# Patient Record
Sex: Female | Born: 1995 | Race: White | Hispanic: No | Marital: Single | Smoking: Never smoker
Health system: Southern US, Community
[De-identification: ages and names within clinical notes are randomized; demographics above are authoritative.]

## PROBLEM LIST (undated history)

## (undated) DIAGNOSIS — E119 Type 2 diabetes mellitus without complications: Secondary | ICD-10-CM

## (undated) DIAGNOSIS — F845 Asperger's syndrome: Secondary | ICD-10-CM

## (undated) HISTORY — PX: CRANIOTOMY FOR TUMOR: SUR345

---

## 2016-04-05 DIAGNOSIS — E1165 Type 2 diabetes mellitus with hyperglycemia: Secondary | ICD-10-CM

## 2016-04-05 DIAGNOSIS — Z794 Long term (current) use of insulin: Secondary | ICD-10-CM

## 2016-06-12 ENCOUNTER — Emergency Department: Payer: Medicare Other

## 2016-06-12 ENCOUNTER — Encounter (HOSPITAL_COMMUNITY): Payer: Self-pay | Admitting: Family Medicine

## 2016-06-12 ENCOUNTER — Emergency Department
Admission: EM | Admit: 2016-06-12 | Discharge: 2016-06-12 | Payer: Medicare Other | Attending: Emergency Medicine | Admitting: Emergency Medicine

## 2016-06-12 ENCOUNTER — Encounter: Payer: Self-pay | Admitting: Emergency Medicine

## 2016-06-12 ENCOUNTER — Observation Stay
Admission: AD | Admit: 2016-06-12 | Payer: Self-pay | Source: Other Acute Inpatient Hospital | Admitting: Internal Medicine

## 2016-06-12 ENCOUNTER — Inpatient Hospital Stay (HOSPITAL_COMMUNITY)
Admission: AD | Admit: 2016-06-12 | Discharge: 2016-06-14 | DRG: 880 | Disposition: A | Discharge status: Home / Self Care | Payer: Medicare Other | Source: Other Acute Inpatient Hospital | Attending: Internal Medicine | Admitting: Internal Medicine

## 2016-06-12 DIAGNOSIS — Z794 Long term (current) use of insulin: Secondary | ICD-10-CM

## 2016-06-12 DIAGNOSIS — G9389 Other specified disorders of brain: Secondary | ICD-10-CM | POA: Diagnosis not present

## 2016-06-12 DIAGNOSIS — Z8249 Family history of ischemic heart disease and other diseases of the circulatory system: Secondary | ICD-10-CM

## 2016-06-12 DIAGNOSIS — R569 Unspecified convulsions: Secondary | ICD-10-CM | POA: Diagnosis not present

## 2016-06-12 DIAGNOSIS — R410 Disorientation, unspecified: Secondary | ICD-10-CM

## 2016-06-12 DIAGNOSIS — Z86011 Personal history of benign neoplasm of the brain: Secondary | ICD-10-CM | POA: Diagnosis not present

## 2016-06-12 DIAGNOSIS — E119 Type 2 diabetes mellitus without complications: Secondary | ICD-10-CM | POA: Diagnosis not present

## 2016-06-12 DIAGNOSIS — F05 Delirium due to known physiological condition: Secondary | ICD-10-CM | POA: Diagnosis not present

## 2016-06-12 DIAGNOSIS — R4701 Aphasia: Secondary | ICD-10-CM | POA: Insufficient documentation

## 2016-06-12 DIAGNOSIS — F441 Dissociative fugue: Secondary | ICD-10-CM | POA: Insufficient documentation

## 2016-06-12 DIAGNOSIS — F845 Asperger's syndrome: Secondary | ICD-10-CM | POA: Diagnosis not present

## 2016-06-12 DIAGNOSIS — E1165 Type 2 diabetes mellitus with hyperglycemia: Secondary | ICD-10-CM | POA: Diagnosis not present

## 2016-06-12 DIAGNOSIS — E876 Hypokalemia: Secondary | ICD-10-CM | POA: Diagnosis present

## 2016-06-12 DIAGNOSIS — R000 Tachycardia, unspecified: Secondary | ICD-10-CM | POA: Diagnosis not present

## 2016-06-12 DIAGNOSIS — Z79899 Other long term (current) drug therapy: Secondary | ICD-10-CM | POA: Diagnosis not present

## 2016-06-12 DIAGNOSIS — R4182 Altered mental status, unspecified: Secondary | ICD-10-CM

## 2016-06-12 DIAGNOSIS — R4689 Other symptoms and signs involving appearance and behavior: Secondary | ICD-10-CM | POA: Diagnosis present

## 2016-06-12 HISTORY — DX: Asperger's syndrome: F84.5

## 2016-06-12 HISTORY — DX: Type 2 diabetes mellitus without complications: E11.9

## 2016-06-12 LAB — COMPREHENSIVE METABOLIC PANEL
ALBUMIN: 4.3 g/dL (ref 3.5–5.0)
ALT: 20 U/L (ref 14–54)
ANION GAP: 10 (ref 5–15)
AST: 23 U/L (ref 15–41)
Alkaline Phosphatase: 54 U/L (ref 38–126)
BUN: 13 mg/dL (ref 6–20)
CHLORIDE: 107 mmol/L (ref 101–111)
CO2: 20 mmol/L — ABNORMAL LOW (ref 22–32)
Calcium: 9.3 mg/dL (ref 8.9–10.3)
Creatinine, Ser: 0.7 mg/dL (ref 0.44–1.00)
GFR calc Af Amer: >60 mL/min (ref >60)
GFR calc non Af Amer: >60 mL/min (ref >60)
GLUCOSE: 221 mg/dL — AB (ref 65–99)
POTASSIUM: 3.5 mmol/L (ref 3.5–5.1)
Sodium: 137 mmol/L (ref 135–145)
Total Bilirubin: 0.6 mg/dL (ref 0.3–1.2)
Total Protein: 7.8 g/dL (ref 6.5–8.1)

## 2016-06-12 LAB — GLUCOSE, CAPILLARY
GLUCOSE-CAPILLARY: 231 mg/dL — AB (ref 65–99)
GLUCOSE-CAPILLARY: 184 mg/dL — AB (ref 65–99)
Glucose-Capillary: 212 mg/dL — ABNORMAL HIGH (ref 65–99)
Glucose-Capillary: 235 mg/dL — ABNORMAL HIGH (ref 65–99)

## 2016-06-12 LAB — URINALYSIS, COMPLETE (UACMP) WITH MICROSCOPIC
BILIRUBIN URINE: NEGATIVE
Bacteria, UA: NONE SEEN
GLUCOSE, UA: 150 mg/dL — AB
KETONES UR: 20 mg/dL — AB
LEUKOCYTES UA: NEGATIVE
Nitrite: NEGATIVE
PH: 6 (ref 5.0–8.0)
PROTEIN: NEGATIVE mg/dL
Specific Gravity, Urine: 1.015 (ref 1.005–1.030)

## 2016-06-12 LAB — AMMONIA: AMMONIA: 34 umol/L (ref 9–35)

## 2016-06-12 LAB — TSH: TSH: 2.247 u[IU]/mL (ref 0.350–4.500)

## 2016-06-12 LAB — CBC
HCT: 37.7 % (ref 35.0–47.0)
HEMOGLOBIN: 13.2 g/dL (ref 12.0–16.0)
MCH: 29.3 pg (ref 26.0–34.0)
MCHC: 35 g/dL (ref 32.0–36.0)
MCV: 83.5 fL (ref 80.0–100.0)
PLATELETS: 244 10*3/uL (ref 150–440)
RBC: 4.51 MIL/uL (ref 3.80–5.20)
RDW: 13.9 % (ref 11.5–14.5)
WBC: 8.4 10*3/uL (ref 3.6–11.0)

## 2016-06-12 LAB — HCG, QUANTITATIVE, PREGNANCY: hCG, Beta Chain, Quant, S: <1 m[IU]/mL (ref <5)

## 2016-06-12 MED ORDER — INSULIN GLARGINE 100 UNIT/ML ~~LOC~~ SOLN
20.0000 [IU] | Freq: Every day | SUBCUTANEOUS | Status: DC
  Administered 2016-06-12 – 2016-06-13 (×2): 20 [IU] via SUBCUTANEOUS
  Filled 2016-06-12 (×2): qty 0.2

## 2016-06-12 MED ORDER — ONDANSETRON HCL 4 MG PO TABS: 4.0000 mg | ORAL_TABLET | Freq: Four times a day (QID) | ORAL | Status: DC | PRN

## 2016-06-12 MED ORDER — SODIUM CHLORIDE 0.9 % IV BOLUS (SEPSIS)
500.0000 mL | Freq: Once | INTRAVENOUS | Status: AC
  Administered 2016-06-12: 500 mL via INTRAVENOUS

## 2016-06-12 MED ORDER — ONDANSETRON HCL 4 MG/2ML IJ SOLN
4.0000 mg | Freq: Four times a day (QID) | INTRAMUSCULAR | Status: DC | PRN
  Administered 2016-06-13: 4 mg via INTRAVENOUS

## 2016-06-12 MED ORDER — ACETAMINOPHEN 650 MG RE SUPP: 650.0000 mg | Freq: Four times a day (QID) | RECTAL | Status: DC | PRN

## 2016-06-12 MED ORDER — INSULIN ASPART 100 UNIT/ML ~~LOC~~ SOLN
0.0000 [IU] | Freq: Three times a day (TID) | SUBCUTANEOUS | Status: DC
  Administered 2016-06-13 (×2): 5 [IU] via SUBCUTANEOUS
  Administered 2016-06-14: 8 [IU] via SUBCUTANEOUS
  Administered 2016-06-14: 2 [IU] via SUBCUTANEOUS
  Administered 2016-06-14: 8 [IU] via SUBCUTANEOUS

## 2016-06-12 MED ORDER — SODIUM CHLORIDE 0.9% FLUSH
3.0000 mL | Freq: Two times a day (BID) | INTRAVENOUS | Status: DC
  Administered 2016-06-12 – 2016-06-14 (×3): 3 mL via INTRAVENOUS

## 2016-06-12 MED ORDER — VENLAFAXINE HCL ER 37.5 MG PO CP24
37.5000 mg | ORAL_CAPSULE | Freq: Two times a day (BID) | ORAL | Status: DC
  Filled 2016-06-12: qty 1

## 2016-06-12 MED ORDER — ACETAMINOPHEN 325 MG PO TABS
650.0000 mg | ORAL_TABLET | Freq: Four times a day (QID) | ORAL | Status: DC | PRN
  Administered 2016-06-14 (×2): 650 mg via ORAL
  Filled 2016-06-12 (×2): qty 2

## 2016-06-12 MED ORDER — INSULIN ASPART 100 UNIT/ML ~~LOC~~ SOLN
0.0000 [IU] | Freq: Every day | SUBCUTANEOUS | Status: DC
  Administered 2016-06-13: 2 [IU] via SUBCUTANEOUS

## 2016-06-12 MED ORDER — ENOXAPARIN SODIUM 40 MG/0.4ML ~~LOC~~ SOLN
40.0000 mg | SUBCUTANEOUS | Status: DC
  Administered 2016-06-12 – 2016-06-13 (×2): 40 mg via SUBCUTANEOUS
  Filled 2016-06-12 (×2): qty 0.4

## 2016-06-12 NOTE — Consult Note (Signed)
NEURO HOSPITALIST CONSULT NOTE   Requestig physician: Dr. Posey Pronto  Reason for Consult: Seizures  History obtained from:   Chart    HPI:                                                                                                                                          Robin Murphy is an 21 y.o. female with Asperger's syndrome who presents to [facility] in transfer from OSH for possible seizures. She initially presented to the OSH for assessment of abnormal behaviors which had begun worsening about January 11, culminating in a night of being locked in her mother's bedroom due to fear of elopement, during which she was not responding, was pacing continuously and eventually urinated on herself.   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 She was brought home by police as bystanders had found her lying on the ground in her pajamas without shoes or a jacket. She also has IDDM type 2; in this context the behavioral change has also manifested in her having recently stopped checking her blood sugars and she also has been consuming sugary foods while lying to her mother about this.    She was transferred to [facility] for EEG monitoring as it was thought that her behavioral changes may be secondary to frontal lobe seizures.   Her PMHx also includes a craniotomy for benign brain tumor at age 80 months.    Past Medical History:  Diagnosis Date  . Asperger syndrome   . Diabetes mellitus without complication [facility])     Past Surgical History:  Procedure Laterality Date  . CRANIOTOMY FOR TUMOR     Age 49 months, for benign brain tumor    Family History  Problem Relation Age of Onset  . Hypertension Mother   . Hypertension Maternal Grandfather   . Skin cancer Maternal Grandfather   . Hypertension Maternal Grandmother    Social History:  reports that she has never smoked. She has never used  smokeless tobacco. She reports that she does not drink alcohol. Her drug history is not on file.  Not on File  MEDICATIONS:  Scheduled: . enoxaparin (LOVENOX) injection  40 mg Subcutaneous Q24H  . insulin aspart  0-15 Units Subcutaneous TID WC  . insulin aspart  0-5 Units Subcutaneous QHS  . insulin glargine  20 Units Subcutaneous QHS  . sodium chloride flush  3 mL Intravenous Q12H   ROS:                                                                                                                                       The patient is mute for most of the evaluation and does not provide a ROS. She also does not reliably answer yes/no to ROS questions.  There were no vitals taken for this visit.   General Examination:                                                                                                      HEENT-  Normocephalic/atraumatic.   Lungs- No gross wheezing. Respirations unlabored.  Extremities- No edema. Warm and well-perfused.   Neurological Examination Mental Status: Initially drowsy, becomes alert during examination. Poor eye contact. Perseverative, short repetitive answers are unrelated to the content of most questions. Does not follow most commands; those that are followed require repeated requests. Limited answers to some questions are fluent. Names thumb, glove and pen; has difficulty naming pinky and index fingers. Initially appears catatonic during exam; when I mention this to nurse, the patient becomes more alert and ask what catatonia means; during my explanation the patient exhibits improved eye contact and smiles occasionally; affect and speech have a childlike quality during this portion of the assessment.  Cranial Nerves: PERRL. Does not follow commands for formal testing of visual fields. Attends to visual stimuli to left and right  occasionally. Horizontal saccades are intact. Does not follow commands for visual pursuits. No ptosis. No nystagmus. Smile is symmetric. Reacts to auditory stmuli. Head rotates to left and right normally. No lingual or pharyngeal dysarthria noted.  Motor: Right : Moves limbs to some commands with no gross asymmetry noted. Does not follow commands for formal strength testing. Tone is normal in all 4 extremities. No twitching, jerking, chorea or other adventitious movements noted.  Sensory: Does not answer questions for sensory testing.  Deep Tendon Reflexes: 2+ brachioradialis, biceps, patella and achilles bilaterally. Toes downgoing bilaterally. Negative Hoffman sign bilaterally.  Cerebellar: Does not follow commands for testing of coordination.  Gait: Deferred.   Lab Results: Basic Metabolic Panel:  Recent Labs Lab 06/12/16 0933  NA 137  K 3.5  CL 107  CO2 20*  GLUCOSE 221*  BUN 13  CREATININE 0.70  CALCIUM 9.3    Liver Function Tests:  Recent Labs Lab 06/12/16 0933  AST 23  ALT 20  ALKPHOS 54  BILITOT 0.6  PROT 7.8  ALBUMIN 4.3   No results for input(s): LIPASE, AMYLASE in the last 168 hours.  Recent Labs Lab 06/12/16 0933  AMMONIA 34    CBC:  Recent Labs Lab 06/12/16 0933  WBC 8.4  HGB 13.2  HCT 37.7  MCV 83.5  PLT 244    Cardiac Enzymes: No results for input(s): CKTOTAL, CKMB, CKMBINDEX, TROPONINI in the last 168 hours.  Lipid Panel: No results for input(s): CHOL, TRIG, HDL, CHOLHDL, VLDL, LDLCALC in the last 168 hours.  CBG:  Recent Labs Lab 06/12/16 0934 06/12/16 1155 06/12/16 1641 06/12/16 2053  GLUCAP 212* 231* 235* 184*    Microbiology: No results found for this or any previous visit.  Coagulation Studies: No results for input(s): LABPROT, INR in the last 72 hours.  Imaging: Ct Head Wo Contrast  Result Date: 06/12/2016 CLINICAL DATA:  Mental status change. EXAM: CT HEAD WITHOUT CONTRAST TECHNIQUE: Contiguous axial images were  obtained from the base of the skull through the vertex without intravenous contrast. COMPARISON:  None. FINDINGS: Brain: Ventricles are normal in size and configuration. 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 All other areas of the brain demonstrate normal gray-white matter attenuation. There is no mass, hemorrhage, edema or other evidence of acute parenchymal abnormality. No extra-axial hemorrhage. Vascular: No hyperdense vessel or unexpected calcification. Skull: Normal. Negative for fracture or focal lesion. Fixation hardware within the right temporal lobe. Sinuses/Orbits: No acute finding. Other: None. IMPRESSION: 1. No acute findings.  No intracranial mass, hemorrhage or edema. 2. 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 Electronically Signed   By: Franki Cabot M.D.   On: 06/12/2016 10:02   Dg Chest Portable 1 View  Result Date: 06/12/2016 CLINICAL DATA:  Altered mental status. EXAM: PORTABLE CHEST 1 VIEW COMPARISON:  None. FINDINGS: The heart size and mediastinal contours are within normal limits. Both lungs are clear. No pneumothorax or pleural effusion is noted. The visualized skeletal structures are unremarkable. IMPRESSION: No acute cardiopulmonary abnormality seen. Electronically Signed   By: Marijo Conception, M.D.   On: 06/12/2016 09:51   Assessment: 1. Behavioral changes in a 21 year old female with a history of Asperger's syndrome and prior benign brain tumor resection. DDx includes frontal or temporal lobe seizures given history of benign brain tumor resection (gliosis adjacent to the resection cavity could serve as a seizure focus). Of note, no twitching, jerking or other adventitious movements are seen on exam.  2. Also on DDx is behavioral changes due to life  stressors and poor coping skills in the setting of Asperger's syndrome.  3. 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 No acute findings are seen.  4. Ca, Na, ammonia, AST, ALT, CBC, TSH are normal. Urinalysis negative for infection.  5. IDDM type 2.  Recommendations: 1. Continuous EEG in the morning.  2. MRI brain with and without contrast.  3. Psychology consultation regarding Asperger's syndrome and recent possible stressors resulting in behavioral changes.  4. ESR,  magnesium, thiamine and vitamin B12 levels.   Electronically signed: Dr. Kerney Elbe 06/12/2016, 10:00 PM

## 2016-06-12 NOTE — ED Provider Notes (Signed)
Patient was signed out to me. Tell psychiatry apparently recommends EEG monitoring as they think this may be a few state. Her neurologist has no ability to do EEG monitoring at this hospital this time. He discussed the patient with hospitalist Dr. Judithann SheenSparks. He agreed to transfer the patient [facility] for continuous EEG monitoring. [facility].----------------------------------------- 5:21 PM on 06/12/2016 -----------------------------------------  I have spoken with the hospitalist on[facility]. Allena KatzPatel he asked me to talk to the neurologist. I'm not sure Dr. Garnette ScheuermannZeilkman and is spoken to the neurologist over there yet and are not but I will call her neurologist.  I discussed the patient with Dr. Petra KubaKilpatrick [facility] and with Dr. Theophilus KindsZeillkman and again with Dr. Allena KatzPatel and Dr. Allena KatzPatel except the patient Dr. Luisa HartPatrick will see the patient when they get there.   Arnaldo NatalPaul F Coleston Dirosa, MD 06/12/16 1739

## 2016-06-12 NOTE — H&P (Signed)
History and Physical  Patient Name: Robin Murphy     WUJ:[id number]    DOB: [birth year]    DOA: 06/12/2016 PCP: [facility]   Patient coming from: Home  Chief Complaint: Abnormal behavior  HPI: Robin Murphy is a 21 y.o. 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    The patient has a history of fugue states/flights, most recently a few weeks ago in the snow, she eloped at night, woke her mother at 5AM saying "the police are in the hall, they just brought me home" because she had wandered outside without shoes/jacket, been found by bystanders lying on the ground in her pajamas.    Her mother states there was an abrupt change in her behavior about Jan. 11th.  She stopped checking her BG, her sugars went up to the 300s-400s, and mother started to notice missing cookie boxes, sunkist cans, Pop Tarts.  When confronted, the patient lied about eating them.  Mother notes she seemed depressed lately; she is NOT currently on antidepressants.  She had previously been on an insulin pump but because of insurance reasons, this was denied and she was currently on just basal bolus insulin, and mother suspects she wasn't giving herself any.  Then last night, mother had a hunch she was going into another fugue state, so she locked her in the mother's bedroom with her to sleep.  Patient was not responding, seemed "absent" and was pacing by the door all night, but couldn't figure out how to get out, urinated on herself at one point.  Then this morning, mother brought her to the ER.    ED course: -Afebrile, heart rate 80, respirations and pulse ox normal, blood pressure 120/72 -Na 137, K 3.5, Cr 0.7, WBC 8.4K, Hgb 13.2 -Glucose 212 -Ammonia normal, TSH normal -UA few ketones, no pyuria or hematuria or bacteria -Pregnancy test negative -Chest x-ray normal -CT head showed chronic encephalomalacia in the right temporal  lobe, overlying hardware, no other abnormalities -ECG unremarkable -The case was discussed with neurology at [facility], then telemetry psychiatry at [facility] and ultimately the decision was made to transport to [facility] for continuous EEG, and our Neurology accepted and patient was sent for transfer          ROS: Review of Systems  Unable to perform ROS: Psychiatric disorder          Past Medical History:  Diagnosis Date  . Asperger syndrome   . Diabetes mellitus without complication [facility])     Past Surgical History:  Procedure Laterality Date  . CRANIOTOMY FOR TUMOR     Age 70 months, for benign brain tumor    Social History: Patient lives with her mother.  She is on disabiltiy for Aspergers.  She does Network engineer, doesn't leave the house.  Is independent with all ADLs.  The patient walks unassisted.  Doesn't smoke.    Not on File  Family history: family history includes Hypertension in her maternal grandfather, maternal grandmother, and mother; Skin cancer in her maternal grandfather.  Prior to Admission medications   Medication Sig Start Date End Date Taking? Authorizing Provider  acetaminophen (TYLENOL) 500 MG tablet Take 500 mg by mouth every 6 (six) hours as needed.    Historical Provider, MD  insulin aspart (NOVOLOG) 100 UNIT/ML injection Inject 14-24 Units into the skin 3 (three) times daily before meals.    Historical Provider, MD  insulin glargine (LANTUS)  100 UNIT/ML injection Inject 30 Units into the skin at bedtime as needed. Up to 30 units qhs and additional dosage 2 hours after her meals prn    Historical Provider, MD  NOVOLOG FLEXPEN 100 UNIT/ML FlexPen Inject 14 Units into the skin 3 (three) times daily with meals. 05/13/16   Historical Provider, MD       Physical Exam: LMP  (LMP Unknown)    General appearance: Well-developed, adult female, awake but closes eyes and goes back to sleep, no acute distress, only makes 1 word answers.   Eyes: Anicteric, conjunctiva  pink, lids and lashes normal. PERRL.    ENT: No nasal deformity, discharge, epistaxis.  Hearing seems normal. OP moist without lesions.   Neck: No neck masses.  Trachea midline.  No thyromegaly/tenderness. Lymph: No cervical or supraclavicular lymphadenopathy. Skin: Warm and dry.  No jaundice.  No suspicious rashes or lesions. Cardiac: RRR, nl S1-S2, no murmurs appreciated.  Capillary refill is brisk.  JVP normal.  No LE edema.  Radial and DP pulses 2+ and symmetric. Respiratory: Normal respiratory rate and rhythm.  CTAB without rales or wheezes. Abdomen: Abdomen soft.  No TTP. No ascites, distension, hepatosplenomegaly.   MSK: No deformities or effusions.  No cyanosis or clubbing. Neuro: Cranial nerves exam patient does not cooperate, but appear grossly symmetric.  Sensation unable to assess. Speech is fluent.  Muscle strength patient moves all extremities but makes minimal effort.    Psych: States it is "Monday".  Says "I don't know" to most questions.   Affect flat.    Labs on Admission:  I have personally reviewed following labs and imaging studies: CBC:  Recent Labs Lab 06/12/16 0933  WBC 8.4  HGB 13.2  HCT 37.7  MCV 83.5  PLT 244   Basic Metabolic Panel:  Recent Labs Lab 06/12/16 0933  NA 137  K 3.5  CL 107  CO2 20*  GLUCOSE 221*  BUN 13  CREATININE 0.70  CALCIUM 9.3   GFR: CrCl cannot be calculated (Unknown ideal weight.).  Liver Function Tests:  Recent Labs Lab 06/12/16 0933  AST 23  ALT 20  ALKPHOS 54  BILITOT 0.6  PROT 7.8  ALBUMIN 4.3   No results for input(s): LIPASE, AMYLASE in the last 168 hours.  Recent Labs Lab 06/12/16 0933  AMMONIA 34   Coagulation Profile: No results for input(s): INR, PROTIME in the last 168 hours. Cardiac Enzymes: No results for input(s): CKTOTAL, CKMB, CKMBINDEX, TROPONINI in the last 168 hours. BNP (last 3 results) No results for input(s): PROBNP in the last 8760 hours. HbA1C: No results for input(s): HGBA1C  in the last 72 hours. CBG:  Recent Labs Lab 06/12/16 0934 06/12/16 1155 06/12/16 1641  GLUCAP 212* 231* 235*   Lipid Profile: No results for input(s): CHOL, HDL, LDLCALC, TRIG, CHOLHDL, LDLDIRECT in the last 72 hours. Thyroid Function Tests:  Recent Labs  06/12/16 0933  TSH 2.247   Anemia Panel: No results for input(s): VITAMINB12, FOLATE, FERRITIN, TIBC, IRON, RETICCTPCT in the last 72 hours. Sepsis Labs: Invalid input(s): PROCALCITONIN, LACTICIDVEN No results found for this or any previous visit (from the past 240 hour(s)).       Radiological Exams on Admission: Personally reviewed: Ct Head Wo Contrast  Result Date: 06/12/2016 CLINICAL DATA:  Mental status change. EXAM: CT HEAD WITHOUT CONTRAST TECHNIQUE: Contiguous axial images were obtained from the base of the skull through the vertex without intravenous contrast. COMPARISON:  None. FINDINGS: Brain: Ventricles are normal in size  and configuration. 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 All other areas of the brain demonstrate normal gray-white matter attenuation. There is no mass, hemorrhage, edema or other evidence of acute parenchymal abnormality. No extra-axial hemorrhage. Vascular: No hyperdense vessel or unexpected calcification. Skull: Normal. Negative for fracture or focal lesion. Fixation hardware within the right temporal lobe. Sinuses/Orbits: No acute finding. Other: None. IMPRESSION: 1. No acute findings.  No intracranial mass, hemorrhage or edema. 2. 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 Electronically Signed   By: Bary RichardStan  Maynard M.D.   On: 06/12/2016 10:02   Dg Chest Portable 1 View  Result Date: 06/12/2016 CLINICAL DATA:  Altered mental status. EXAM: PORTABLE CHEST 1 VIEW COMPARISON:   None. FINDINGS: The heart size and mediastinal contours are within normal limits. Both lungs are clear. No pneumothorax or pleural effusion is noted. The visualized skeletal structures are unremarkable. IMPRESSION: No acute cardiopulmonary abnormality seen. Electronically Signed   By: Lupita RaiderJames  Green Jr, M.D.   On: 06/12/2016 09:51    EKG: Independently reviewed. Rate 76, QTc 429, normal sinus rhythm.    Assessment/Plan  1. Abnormal behavior:  Suspect depression, ?fugue.  Differential includes temporal lobe seizures, obviously, given her history of tumor removal as infant.   -Consult to Neurology -Consult to Psychiatry -Flight risk, discussed with nursing   2. Insulin dependent diabetes:  Usually on Lantus 30 nightly at home.  Currently glucose near normal.  No evidence of DKA or significant hyperglycemia driving symptoms. -Check [id number] -Lantus 20 -SSI with meals       DVT prophylaxis: Lovenox  Code Status: FULL  Family Communication:  Mother by phone  Disposition Plan: Anticipate Neurology consultation for vEEG or cEEG and then Psychiatry consultation. Consults called: Neuro Admission status: OBS At the point of initial evaluation, it is my clinical opinion that admission for OBSERVATION is reasonable and necessary because the patient's presenting complaints in the context of their chronic conditions represent sufficient risk of deterioration or significant morbidity to constitute reasonable grounds for close observation in the hospital setting, but that the patient may be medically stable for discharge from the hospital within 24 to 48 hours.    Medical decision making: Patient seen at 8:52 PM on 06/12/2016.  What exists of the patient's chart was reviewed in depth and summarized above.  Clinical condition: stable.        Alberteen SamChristopher P Danford [facility] Pager [phone]

## 2016-06-12 NOTE — ED Notes (Addendum)
Spoke with patient's mother on the phone regarding possible [facility] and EEG monitoring. Patient's mother verbalizes agreement for transfer and requests updates when plans are finalized.

## 2016-06-12 NOTE — ED Notes (Signed)
Unable to complete remainder of triage at this time due to patient being non-verbal and no family present at this time.

## 2016-06-12 NOTE — ED Provider Notes (Addendum)
[facility] Emergency Department Provider Note   ____________________________________________   First MD Initiated Contact with Patient 06/12/16 [phone]     (approximate)  I have reviewed the triage vital signs and the nursing notes.   HISTORY  Chief Complaint Altered Mental Status    HPI Robin Murphy is a 21 y.o. female here for evaluation of alteration of mental status  EM caveat: The patient is nonverbal  The patient's mother provides most history. 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 Mom reports that over the last 2 weeks patient has had occasional strange behavior, she has a known history of wandering in the past and that time she is had to have the police and helicopter look for her. Mom reports that patient has diabetes, she came into her room last night around midnight and seemed confused, reported to her mom that her blood sugar was in the 160s and she gave herself insulin, but her mom checked her meter etc. was in the 460s, and patient has been wandering about the home, walking, speaking 1-2 words at a time, and had urinated herself today.  The mother reports hospitalizations or similar evaluations in [place] and [place], and his lymphoma passes been related to a "blood sugar problem   Past Medical History:  Diagnosis Date  . Asperger syndrome   . Diabetes mellitus without complication (HCC)     There are no active problems to display for this patient.   No past surgical history on file.  Prior to Admission medications   Not on File    Allergies Patient has no allergy information on record.  No family history on file.  Social History Social History  Substance Use Topics  . Smoking status: Never Smoker  . Smokeless tobacco: Never Used  . Alcohol use No    Review of Systems - obtained from patient's mother Constitutional: No fever/chills Eyes: No visual  changes. Cardiovascular: Denies chest pain. Respiratory: Denies shortness of breath. Gastrointestinal: No abdominal pain.  No nausea, no vomiting.   Genitourinary: Negative for dysuria. Patient did urinate on herself today while walking about.  Musculoskeletal: Negative for back pain. Skin: Negative for rash. Neurological: Negative for headaches, focal weakness or numbness. No seizure activity has been observed  10-point ROS otherwise negative.  ____________________________________________   PHYSICAL EXAM:  VITAL SIGNS: ED Triage Vitals [06/12/16 0906]  Enc Vitals Group     BP 128/72     Pulse Rate 80     Resp 16     Temp 97.7 F (36.5 C)     Temp Source Oral     SpO2 99 %     Weight      Height      Head Circumference      Peak Flow      Pain Score      Pain Loc      Pain Edu?      Excl. in GC?     Constitutional: Alert But nonverbal. Well appearing and in no acute distress. He is calm, placid.She does not verbalize, but does have her eyes open, follows and tracks the examiner, she will perform things like open her mouth for temperature checking. There is no seizure-like activity or tremulousness. Eyes: Conjunctivae are normal. PERRL. EOMI. Head: Atraumatic. Nose: No congestion/rhinnorhea. Mouth/Throat: Mucous membranes are moist.  Oropharynx non-erythematous. Neck: No stridor.   Cardiovascular: Normal rate, regular rhythm. Grossly normal heart  sounds.  Good peripheral circulation. Respiratory: Normal respiratory effort.  No retractions. Lungs CTAB. Gastrointestinal: Soft and nontender. No distention. No abdominal bruits. No CVA tenderness. Musculoskeletal: No lower extremity tenderness nor edema.  No joint effusions. Neurologic:  Aphasic No gross focal neurologic deficits are appreciated. She moves all extremities with normal strength. Normal reflexes. No rigidity. No tremors. No focal deficits are noted Skin:  Skin is warm, dry and intact. No rash  noted. Psychiatric: Mood and affect are very flat.  ____________________________________________   LABS (all labs ordered are listed, but only abnormal results are displayed)  Labs Reviewed  COMPREHENSIVE METABOLIC PANEL - Abnormal; Notable for the following:       Result Value   CO2 20 (*)    Glucose, Bld 221 (*)    All other components within normal limits  URINALYSIS, COMPLETE (UACMP) WITH MICROSCOPIC - Abnormal; Notable for the following:    Color, Urine YELLOW (*)    APPearance CLEAR (*)    Glucose, UA 150 (*)    Hgb urine dipstick SMALL (*)    Ketones, ur 20 (*)    Squamous Epithelial / LPF 0-5 (*)    All other components within normal limits  GLUCOSE, CAPILLARY - Abnormal; Notable for the following:    Glucose-Capillary 212 (*)    All other components within normal limits  GLUCOSE, CAPILLARY - Abnormal; Notable for the following:    Glucose-Capillary 231 (*)    All other components within normal limits  URINE CULTURE  CBC  AMMONIA  TSH  HCG, QUANTITATIVE, PREGNANCY  CBG MONITORING, ED  CBG MONITORING, ED   ____________________________________________  EKG  ED ECG REPORT I, Zae Kirtz, the attending physician, personally viewed and interpreted this ECG.  Date: 06/12/2016 EKG Time: 1052 Rate: 75 Rhythm: normal sinus rhythm QRS Axis: normal Intervals: normal ST/T Wave abnormalities: normal Conduction Disturbances: none Narrative Interpretation: unremarkable except for mild early repolarization abnormality  ____________________________________________  RADIOLOGY  Ct Head Wo Contrast  Result Date: 06/12/2016 CLINICAL DATA:  Mental status change. EXAM: CT HEAD WITHOUT CONTRAST TECHNIQUE: Contiguous axial images were obtained from the base of the skull through the vertex without intravenous contrast. COMPARISON:  None. FINDINGS: Brain: Ventricles are normal in size and configuration. 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 All other areas of the brain demonstrate normal gray-white matter attenuation. There is no mass, hemorrhage, edema or other evidence of acute parenchymal abnormality. No extra-axial hemorrhage. Vascular: No hyperdense vessel or unexpected calcification. Skull: Normal. Negative for fracture or focal lesion. Fixation hardware within the right temporal lobe. Sinuses/Orbits: No acute finding. Other: None. IMPRESSION: 1. No acute findings.  No intracranial mass, hemorrhage or edema. 2. 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 Electronically Signed   By: Bary Richard M.D.   On: 06/12/2016 10:02   Dg Chest Portable 1 View  Result Date: 06/12/2016 CLINICAL DATA:  Altered mental status. EXAM: PORTABLE CHEST 1 VIEW COMPARISON:  None. FINDINGS: The heart size and mediastinal contours are within normal limits. Both lungs are clear. No pneumothorax or pleural effusion is noted. The visualized skeletal structures are unremarkable. IMPRESSION: No acute cardiopulmonary abnormality seen. Electronically Signed   By: Lupita Raider, M.D.   On: 06/12/2016 09:51    ____________________________________________   PROCEDURES  Procedure(s) performed: None  Procedures  Critical Care performed: No  ____________________________________________   INITIAL IMPRESSION / ASSESSMENT AND PLAN / ED COURSE  Pertinent labs & imaging results that were available during my care of the patient were reviewed by me and considered in my medical decision making (see chart for details).  Patient presents with alteration in mental status. She is afebrile, in no distress, and exhibits no obvious evidence of seizure-like activity. No recent psychiatric concerns, though the mother does report she is suspicious about a possible mental  health type issue as the patient does have a history of wandering and Asperger's  Do not have a high suspicion for meningitis, the patient does not demonstrate a fever, meningismus, or any infectious symptomatology. I suspect tensely primarily a neurologic etiology such as possible intermittent temporal region seizures or potentially a psychiatric component given the patient's previous history of wandering, Asperger's, etc.  Clinical Course as of Jun 12 1556  Sun Jun 12, 2016  1328 Telepsych consult requested.  [MQ]  1507 Patient is resting comfortably, in no distress. Awaiting psychiatry consultation recommendations. The patient will follow commands, but continues not to verbalize and prefers to rest. Blood sugar stable. Ongoing care including plan of follow-up with psychiatry assigned to Dr. Juliette AlcideMelinda. Thereafter, anticipate the patient may require admission whether this would be beneficial and the psychiatry service or medical service is somewhat unclear, but I believe the amp of her psychiatry team/consult will be helpful in determining this  [MQ]    Clinical Course User Index [MQ] Sharyn CreamerMark Dwayne Bulkley, MD   ----------------------------------------- 12:52 PM on 06/12/2016 -----------------------------------------  I discussed the case with Dr. Loretha BrasilZeylikman of neurology. He recommends the patient be admitted, that the patient should not be discharged without a neurology consultation. At this time he does not have any recommendations for medication management, and recommends following the patient clinically in observing overnight.  Discussed the case with the patient's mother, she is in agreement with the plan for admission. Discussed with Dr. Judithann SheenSparks, he was seen and evaluated the patient for admission for alteration in mental status.  ____________________________________________   FINAL CLINICAL IMPRESSION(S) / ED DIAGNOSES  Final diagnoses:  Delirium      NEW MEDICATIONS STARTED DURING THIS  VISIT:  New Prescriptions   No medications on file     Note:  This document was prepared using Dragon voice recognition software and may include unintentional dictation errors.     Sharyn CreamerMark Aspen Deterding, MD 06/12/16 1253  ----------------------------------------- 1:22 PM on 06/12/2016 -----------------------------------------  Patient seen/reviewed by Dr. Judithann SheenSparks. Patient cannot be admitted at present time, we will obtain a psychiatric consultation for further elucidation of patient's differential for alteration in mental status. I have requested a tele-psych consult at this time.  ----------------------------------------- 3:58 PM on 06/12/2016 -----------------------------------------  Discussed case with Dr. Maricela BoSprague, who plans to discuss with the patient's mother, and see the patient in consultation make recommendations. Dr. Juliette AlcideMelinda to follow-up on recommendations from Dr. Maricela BoSprague.     Sharyn CreamerMark Cassandria Drew, MD 06/12/16 [phone]

## 2016-06-12 NOTE — ED Notes (Signed)
Telepsych on monitor with patient at this time. Patient unable to stay awake to answer questions. VSS. Will continue to monitor.

## 2016-06-12 NOTE — ED Triage Notes (Signed)
Patient brought in by [facility] from home for change in mental status. Per EMS, patient mother reports around midnight patients mental status changed, she became non-verbal and was pacing around the house. Patient also urinated in her mothers floor this morning which is not normal for her. On arrival to ED patient alert but still non-verbal, VSS at this time.

## 2016-06-12 NOTE — ED Notes (Signed)
Patient restless in bed. IV removed and IV fluids noted on bed. Site not bleeding at this time. Patient repositioned. Will continue to monitor.

## 2016-06-12 NOTE — ED Notes (Signed)
Left voicemail with patient's mother regarding patient's transfer. Provided her with room number and phone number for unit.

## 2016-06-13 ENCOUNTER — Observation Stay (HOSPITAL_COMMUNITY): Payer: Medicare Other

## 2016-06-13 DIAGNOSIS — Z8249 Family history of ischemic heart disease and other diseases of the circulatory system: Secondary | ICD-10-CM | POA: Diagnosis not present

## 2016-06-13 DIAGNOSIS — R569 Unspecified convulsions: Secondary | ICD-10-CM

## 2016-06-13 DIAGNOSIS — F845 Asperger's syndrome: Secondary | ICD-10-CM

## 2016-06-13 DIAGNOSIS — F05 Delirium due to known physiological condition: Secondary | ICD-10-CM | POA: Diagnosis present

## 2016-06-13 DIAGNOSIS — Z79899 Other long term (current) drug therapy: Secondary | ICD-10-CM | POA: Diagnosis not present

## 2016-06-13 DIAGNOSIS — Z794 Long term (current) use of insulin: Secondary | ICD-10-CM | POA: Diagnosis not present

## 2016-06-13 DIAGNOSIS — E119 Type 2 diabetes mellitus without complications: Secondary | ICD-10-CM

## 2016-06-13 DIAGNOSIS — E1165 Type 2 diabetes mellitus with hyperglycemia: Secondary | ICD-10-CM | POA: Diagnosis present

## 2016-06-13 DIAGNOSIS — R4689 Other symptoms and signs involving appearance and behavior: Secondary | ICD-10-CM | POA: Diagnosis present

## 2016-06-13 DIAGNOSIS — R410 Disorientation, unspecified: Secondary | ICD-10-CM | POA: Diagnosis present

## 2016-06-13 DIAGNOSIS — Z808 Family history of malignant neoplasm of other organs or systems: Secondary | ICD-10-CM | POA: Diagnosis not present

## 2016-06-13 DIAGNOSIS — E876 Hypokalemia: Secondary | ICD-10-CM

## 2016-06-13 DIAGNOSIS — Z86011 Personal history of benign neoplasm of the brain: Secondary | ICD-10-CM | POA: Diagnosis not present

## 2016-06-13 DIAGNOSIS — G9389 Other specified disorders of brain: Secondary | ICD-10-CM | POA: Diagnosis present

## 2016-06-13 DIAGNOSIS — R000 Tachycardia, unspecified: Secondary | ICD-10-CM | POA: Diagnosis present

## 2016-06-13 LAB — CBC
HEMATOCRIT: 39.6 % (ref 36.0–46.0)
Hemoglobin: 13 g/dL (ref 12.0–15.0)
MCH: 28.2 pg (ref 26.0–34.0)
MCHC: 32.8 g/dL (ref 30.0–36.0)
MCV: 85.9 fL (ref 78.0–100.0)
Platelets: 270 10*3/uL (ref 150–400)
RBC: 4.61 MIL/uL (ref 3.87–5.11)
RDW: 14 % (ref 11.5–15.5)
WBC: 6 10*3/uL (ref 4.0–10.5)

## 2016-06-13 LAB — BASIC METABOLIC PANEL
ANION GAP: 11 (ref 5–15)
BUN: 10 mg/dL (ref 6–20)
CO2: 21 mmol/L — ABNORMAL LOW (ref 22–32)
Calcium: 9.3 mg/dL (ref 8.9–10.3)
Chloride: 108 mmol/L (ref 101–111)
Creatinine, Ser: 0.77 mg/dL (ref 0.44–1.00)
GFR calc Af Amer: >60 mL/min (ref >60)
GFR calc non Af Amer: >60 mL/min (ref >60)
GLUCOSE: 148 mg/dL — AB (ref 65–99)
POTASSIUM: 3.1 mmol/L — AB (ref 3.5–5.1)
Sodium: 140 mmol/L (ref 135–145)

## 2016-06-13 LAB — URINE CULTURE: SPECIAL REQUESTS: NORMAL

## 2016-06-13 LAB — VITAMIN B12: Vitamin B-12: 503 pg/mL (ref 180–914)

## 2016-06-13 LAB — MAGNESIUM: Magnesium: 2.1 mg/dL (ref 1.7–2.4)

## 2016-06-13 LAB — SEDIMENTATION RATE: Sed Rate: 23 mm/hr — ABNORMAL HIGH (ref 0–22)

## 2016-06-13 LAB — GLUCOSE, CAPILLARY
GLUCOSE-CAPILLARY: 218 mg/dL — AB (ref 65–99)
Glucose-Capillary: 133 mg/dL — ABNORMAL HIGH (ref 65–99)

## 2016-06-13 MED ORDER — ONDANSETRON HCL 4 MG/2ML IJ SOLN
INTRAMUSCULAR | Status: AC
  Administered 2016-06-13: 4 mg via INTRAVENOUS
  Filled 2016-06-13: qty 2

## 2016-06-13 MED ORDER — POTASSIUM CHLORIDE CRYS ER 20 MEQ PO TBCR
40.0000 meq | EXTENDED_RELEASE_TABLET | Freq: Once | ORAL | Status: AC
  Administered 2016-06-13: 40 meq via ORAL
  Filled 2016-06-13: qty 2

## 2016-06-13 MED ORDER — LORAZEPAM 2 MG/ML IJ SOLN
1.0000 mg | Freq: Once | INTRAMUSCULAR | Status: AC
  Administered 2016-06-13: 1 mg via INTRAVENOUS
  Filled 2016-06-13: qty 1

## 2016-06-13 NOTE — Progress Notes (Signed)
LTM hooked up and event button tested. Nurse notified of the event button.

## 2016-06-13 NOTE — Progress Notes (Signed)
Pt was direct admit from [facility], transported by care link, pt is confused cannot make decision for herself, able to communicate but sometimes will not respond to questions the common word that she has been using is " ok'' ,  She lives home with her mom, Pt mom wants us to call her if we need any information admission not done since no family is around and pt not in her rightful mind to make decision, self introduced to pt ID bracelet checked fall prevention plan discuss with pt but she has poor safety awareness on bed alarm skin assessment done no skin issue hooked to tele and CCMD informed pt is incontinent per mom that is not her base line, did in and out cath urine output 900,

## 2016-06-13 NOTE — Progress Notes (Signed)
   06/13/16 1240  What Happened  Was fall witnessed? Yes  Who witnessed fall? Nehemiah Settle(Brooke NT)  Patients activity before fall to/from bed, chair, or stretcher  Point of contact other (comment) (Knee)  Was patient injured? No  Follow Up  MD notified Benjamine Mola(Vann)  Time MD notified [id number]  Family notified Yes-comment Coral Ceo(Jessica Sundell)  Time family notified [phone]  Additional tests No  Simple treatment Other (comment) (range of motion)  Adult Fall Risk Assessment  Risk Factor Category (scoring not indicated) Fall has occurred during this admission (document High fall risk)  Age 21  Fall History: Fall within 6 months prior to admission 0  Elimination; Bowel and/or Urine Incontinence 0  Elimination; Bowel and/or Urine Urgency/Frequency 0  Medications: includes PCA/Opiates, Anti-convulsants, Anti-hypertensives, Diuretics, Hypnotics, Laxatives, Sedatives, and Psychotropics 0  Patient Care Equipment 2  Mobility-Assistance 2  Mobility-Gait 2  Mobility-Sensory Deficit 0  Altered awareness of immediate physical environment 1  Impulsiveness 2  Lack of understanding of one's physical/cognitive limitations 4  Total Score 13  Patient's Fall Risk High Fall Risk (>13 points)  Adult Fall Risk Interventions  Required Bundle Interventions *See Row Information* High fall risk - low, moderate, and high requirements implemented  Additional Interventions Camera surveillance (with patient/family notification & education);Other (Comment) (telesitter)  Vitals  Temp 98.3 F (36.8 C)  Temp Source Oral  BP 129/75  BP Location Right Arm  BP Method Automatic  Pulse Rate (!) 125  Resp 16  Oxygen Therapy  SpO2 100 %  O2 Device Room Air  Pain Assessment  Pain Assessment No/denies pain  Neurological  Neuro (WDL) X  Level of Consciousness Alert  Orientation Level Oriented to person;Disoriented to place;Disoriented to time;Disoriented to situation  Cognition Impulsive;Unable to follow commands;Poor judgement;Poor  attention/concentration;Memory impairment  Speech Clear  Pupil Assessment  No  Musculoskeletal  Musculoskeletal (WDL) X  Integumentary  Integumentary (WDL) WDL

## 2016-06-13 NOTE — Progress Notes (Signed)
PROGRESS NOTE    Robin Murphy  ION:[id number] DOB: May 25, 1995 DOA: 06/12/2016 PCP: [facility]   Outpatient Specialists:     Brief Narrative:  Robin Murphy is a 21 y.o. 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    The patient has a history of fugue states/flights, most recently a few weeks ago in the snow, she eloped at night, woke her mother at 5AM saying "the police are in the hall, they just brought me home" because she had wandered outside without shoes/jacket, been found by bystanders lying on the ground in her pajamas.    Her mother states there was an abrupt change in her behavior about Jan. 11th.  She stopped checking her BG, her sugars went up to the 300s-400s, and mother started to notice missing cookie boxes, sunkist cans, Pop Tarts.  When confronted, the patient lied about eating them.  Mother notes she seemed depressed lately; she is NOT currently on antidepressants.  She had previously been on an insulin pump but because of insurance reasons, this was denied and she was currently on just basal bolus insulin, and mother suspects she wasn't giving herself any.  Then last night, mother had a hunch she was going into another fugue state, so she locked her in the mother's bedroom with her to sleep.  Patient was not responding, seemed "absent" and was pacing by the door all night, but couldn't figure out how to get out, urinated on herself at one point.  Then this morning, mother brought her to the ER.     Assessment & Plan:   Principal Problem:   Acute confusional state Active Problems:   Uncontrolled type 2 diabetes mellitus with hyperglycemia, with long-term current use of insulin (HCC)    Abnormal behavior:  Suspect depression vs temporal lobe seizures given her history of tumor removal as infant.   -Consult to Neurology -Consult to Psychiatry -getting continuous EEG  todat   Insulin dependent diabetes:  Usually on Lantus 30 nightly at home.  Currently glucose near normal.  No evidence of DKA or significant hyperglycemia driving symptoms. -Check [id number] -Lantus 20 -SSI with meals  Sinus tachy -appears anxious HR 100-140s  Hypokalemia -replete   DVT prophylaxis:  Lovenox   Code Status: Full Code   Family Communication: patient  Disposition Plan:     Consultants:   Psych  neuro    Subjective: Tearful- says she is on her "period"  Objective: Vitals:   06/13/16 0647 06/13/16 1240  BP: (!) 116/53 129/75  Pulse: 69 (!) 125  Resp: 17 16  Temp: 97.7 F (36.5 C) 98.3 F (36.8 C)  TempSrc: Oral Oral  SpO2: 100% 100%    Intake/Output Summary (Last 24 hours) at 06/13/16 1348 Last data filed at 06/13/16 [id number]  Gross per 24 hour  Intake                0 ml  Output              900 ml  Net             -900 ml   There were no vitals filed for this visit.  Examination:  General exam: Appears calm and comfortable- seen with psychiatry  Respiratory system: Clear to auscultation. Respiratory effort normal. Cardiovascular system: S1 & S2 heard, RRR. No JVD, murmurs, rubs, gallops or clicks. No pedal edema. Gastrointestinal system: Abdomen is nondistended, soft and  nontender. No organomegaly or masses felt. Normal bowel sounds heard. Central nervous system: Alert    Data Reviewed: I have personally reviewed following labs and imaging studies  CBC:  Recent Labs Lab 06/12/16 0933 06/13/16 0730  WBC 8.4 6.0  HGB 13.2 13.0  HCT 37.7 39.6  MCV 83.5 85.9  PLT 244 270   Basic Metabolic Panel:  Recent Labs Lab 06/12/16 0933 06/13/16 0730  NA 137 140  K 3.5 3.1*  CL 107 108  CO2 20* 21*  GLUCOSE 221* 148*  BUN 13 10  CREATININE 0.70 0.77  CALCIUM 9.3 9.3  MG  --  2.1   GFR: CrCl cannot be calculated (Unknown ideal weight.). Liver Function Tests:  Recent Labs Lab 06/12/16 0933  AST 23  ALT 20   ALKPHOS 54  BILITOT 0.6  PROT 7.8  ALBUMIN 4.3   No results for input(s): LIPASE, AMYLASE in the last 168 hours.  Recent Labs Lab 06/12/16 0933  AMMONIA 34   Coagulation Profile: No results for input(s): INR, PROTIME in the last 168 hours. Cardiac Enzymes: No results for input(s): CKTOTAL, CKMB, CKMBINDEX, TROPONINI in the last 168 hours. BNP (last 3 results) No results for input(s): PROBNP in the last 8760 hours. HbA1C: No results for input(s): HGBA1C in the last 72 hours. CBG:  Recent Labs Lab 06/12/16 1155 06/12/16 1641 06/12/16 2053 06/13/16 0754 06/13/16 1157  GLUCAP 231* 235* 184* 133* 218*   Lipid Profile: No results for input(s): CHOL, HDL, LDLCALC, TRIG, CHOLHDL, LDLDIRECT in the last 72 hours. Thyroid Function Tests:  Recent Labs  06/12/16 0933  TSH 2.247   Anemia Panel:  Recent Labs  06/13/16 0730  VITAMINB12 503   Urine analysis:    Component Value Date/Time   COLORURINE YELLOW (A) 06/12/2016 0933   APPEARANCEUR CLEAR (A) 06/12/2016 0933   LABSPEC 1.015 06/12/2016 0933   PHURINE 6.0 06/12/2016 0933   GLUCOSEU 150 (A) 06/12/2016 0933   HGBUR SMALL (A) 06/12/2016 0933   BILIRUBINUR NEGATIVE 06/12/2016 0933   KETONESUR 20 (A) 06/12/2016 0933   PROTEINUR NEGATIVE 06/12/2016 0933   NITRITE NEGATIVE 06/12/2016 0933   LEUKOCYTESUR NEGATIVE 06/12/2016 0933    ) Recent Results (from the past 240 hour(s))  Urine culture     Status: Abnormal   Collection Time: 06/12/16  9:33 AM  Result Value Ref Range Status   Specimen Description URINE, CLEAN CATCH  Final   Special Requests Normal  Final   Culture (A)  Final    <10,000 COLONIES/mL INSIGNIFICANT GROWTH Performed at [facility], [address] [place], [place] [address]    Report Status 06/13/2016 FINAL  Final      Anti-infectives    None       Radiology Studies: Ct Head Wo Contrast  Result Date: 06/12/2016 CLINICAL DATA:  Mental status change. EXAM: CT HEAD WITHOUT  CONTRAST TECHNIQUE: Contiguous axial images were obtained from the base of the skull through the vertex without intravenous contrast. COMPARISON:  None. FINDINGS: Brain: Ventricles are normal in size and configuration. 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 All other areas of the brain demonstrate normal gray-white matter attenuation. There is no mass, hemorrhage, edema or other evidence of acute parenchymal abnormality. No extra-axial hemorrhage. Vascular: No hyperdense vessel or unexpected calcification. Skull: Normal. Negative for fracture or focal lesion. Fixation hardware within the right temporal lobe. Sinuses/Orbits: No acute finding. Other: None. IMPRESSION: 1. No acute  findings.  No intracranial mass, hemorrhage or edema. 2. 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 Electronically Signed   By: Bary Richard M.D.   On: 06/12/2016 10:02   Mr Brain Wo Contrast  Result Date: 06/13/2016 CLINICAL DATA:  Seizures. Unusual behavior. Altered mental status. Asperger's syndrome. Diabetes mellitus EXAM: MRI HEAD WITHOUT CONTRAST TECHNIQUE: Multiplanar, multiecho pulse sequences of the brain and surrounding structures were obtained without intravenous contrast. COMPARISON:  None. FINDINGS: Significant motion degradation. The patient could not complete the examination despite administration of medications. No postcontrast images were obtained. Only sagittal T1, axial and coronal diffusion, T2, and FLAIR images were obtained. Brain: No evidence for acute stroke, acute hemorrhage, mass lesion, hydrocephalus, or extra-axial fluid. 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 There is extensive encephalomalacia extending  as far back as 5 cm from the temporal tip. Gliosis in the residual tissue is suggested on motion degraded FLAIR imaging. Almost certainly there is brain substance loss involving the hippocampus based on the CT appearance with encephalomalacia of the medial and inferior temporal lobe, extending to the suprasellar cistern. No gradient sequence is obtained to suggest whether there is superimposed hemosiderin staining of the surgical bed or surrounding sulci. Vascular: Normal flow voids. Skull and upper cervical spine: Normal marrow signal.Physiologic prominence of the pituitary, convex upward, noncompressive. Sinuses/Orbits: No gross abnormality. Other: None. IMPRESSION: Motion degraded examination demonstrating extensive RIGHT temporal lobe encephalomalacia. Post infusion imaging was not obtained, although there are no concerning features for an active process or residual tumor on this noncontrast exam. Electronically Signed   By: Elsie Stain M.D.   On: 06/13/2016 10:56   Dg Chest Portable 1 View  Result Date: 06/12/2016 CLINICAL DATA:  Altered mental status. EXAM: PORTABLE CHEST 1 VIEW COMPARISON:  None. FINDINGS: The heart size and mediastinal contours are within normal limits. Both lungs are clear. No pneumothorax or pleural effusion is noted. The visualized skeletal structures are unremarkable. IMPRESSION: No acute cardiopulmonary abnormality seen. Electronically Signed   By: Lupita Raider, M.D.   On: 06/12/2016 09:51        Scheduled Meds: . enoxaparin (LOVENOX) injection  40 mg Subcutaneous Q24H  . insulin aspart  0-15 Units Subcutaneous TID WC  . insulin aspart  0-5 Units Subcutaneous QHS  . insulin glargine  20 Units Subcutaneous QHS  . potassium chloride  40 mEq Oral Once  . sodium chloride flush  3 mL Intravenous Q12H   Continuous Infusions:   LOS: 0 days    Time spent: 25 min    JESSICA U VANN, DO [facility] Pager [phone]  If 7PM-7AM, please contact  night-coverage [email or web address] Password [email or web address] 06/13/2016, 1:48 PM

## 2016-06-13 NOTE — Progress Notes (Signed)
Inpatient Diabetes Program Recommendations  AACE/ADA: New Consensus Statement on Inpatient Glycemic Control (2015)  Target Ranges:  Prepandial:   less than 140 mg/dL      Peak postprandial:   less than 180 mg/dL (1-2 hours)      Critically ill patients:  140 - 180 mg/dL   Lab Results  Component Value Date   GLUCAP 218 (H) 06/13/2016    Review of Glycemic Control  Diabetes history: DM2 Current orders for Inpatient glycemic control:     Moderate correction scale Novolog 0-15 units TIDAC and 0-5 units QHS    Lantus 20 units QHS  Inpatient Diabetes Program Recommendations:    Please consider weighing patient and calculating BMI when able.    Please consider Meal coverage of Novolog 3 units TIDAC if patient eats > 50% of meal.    Will follow A1C.  Thank you,  Kristine LineaKaren Kaili Castille, RN, MSN Diabetes Coordinator Inpatient Diabetes Program [phone] (Team Pager)

## 2016-06-13 NOTE — Consult Note (Signed)
[place] Psychiatry Consult   Reason for Consult:  Abnormal behaviors Referring Physician:  Dr. Eliseo Squires Patient Identification: Robin Murphy MRN:  [id number] Principal Diagnosis: Acute confusional state Diagnosis:   Patient Active Problem List   Diagnosis Date Noted  . Acute confusional state [F05] 06/12/2016  . Uncontrolled type 2 diabetes mellitus with hyperglycemia, with long-term current use of insulin ([place]) [E11.65, Z79.4] 04/05/2016    Total Time spent with patient: 1 hour  Subjective:   Robin Murphy is a 21 y.o. female patient admitted with abnormal behaviors like fugue state and running out of home.Marland Kitchen  HPI:  Robin Murphy is a 21 y.o. 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 Patient seen, chart reviewed and case discussed with Dr. Eliseo Squires. Patient is awake, alert, oriented to time, place and person but not to the situation. Patient reported she is stressed about arguments with her mother,   school studies and not able to remember things. Patient reportedly graduated from the high school and currently taking online courses for collage, and reportedly stressful. Patient lives with her mother and 104 years old brother. Patient is not able to provide any's space figure information about outpatient or inpatient psychiatric treatment except saying I do not remember or I cannot recall. Unable to reach patient mother on phone today for collateral information and patient has been under neurological investigation for possible seizure activity. Patient reportedly found on the floor and staff members to came to check on her she reported she does not know what happened.  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  Risk to Self:   Risk to Others:   Prior Inpatient Therapy:   Prior  Outpatient Therapy:    Past Medical History:  Past Medical History:  Diagnosis Date  . Asperger syndrome   . Diabetes mellitus without complication [facility])     Past Surgical History:  Procedure Laterality Date  . CRANIOTOMY FOR TUMOR     Age 62 months, for benign brain tumor   Family History:  Family History  Problem Relation Age of Onset  . Hypertension Mother   . Hypertension Maternal Grandfather   . Skin cancer Maternal Grandfather   . Hypertension Maternal Grandmother    Family Psychiatric  History: Patient denied family history of mental illness. Social History:  History  Alcohol Use No     History  Drug use: Unknown    Social History   Social History  . Marital status: Single    Spouse name: N/A  . Number of children: N/A  . Years of education: N/A   Social History Main Topics  . Smoking status: Never Smoker  . Smokeless tobacco: Never Used  . Alcohol use No  . Drug use: Unknown  . Sexual activity: Not Asked   Other Topics Concern  . None   Social History Narrative  . None   Additional Social History:    Allergies:  Not on File  Labs:  Results for orders placed or performed during the hospital encounter of 06/12/16 (from the past 48 hour(s))  Glucose, capillary     Status: Abnormal   Collection Time: 06/12/16  8:53 PM  Result Value Ref Range   Glucose-Capillary 184 (H) 65 - 99 mg/dL  CBC     Status: None   Collection Time: 06/13/16  7:30 AM  Result  Value Ref Range   WBC 6.0 4.0 - 10.5 K/uL   RBC 4.61 3.87 - 5.11 MIL/uL   Hemoglobin 13.0 12.0 - 15.0 g/dL   HCT 39.6 36.0 - 46.0 %   MCV 85.9 78.0 - 100.0 fL   MCH 28.2 26.0 - 34.0 pg   MCHC 32.8 30.0 - 36.0 g/dL   RDW 14.0 11.5 - 15.5 %   Platelets 270 150 - 400 K/uL  Basic metabolic panel     Status: Abnormal   Collection Time: 06/13/16  7:30 AM  Result Value Ref Range   Sodium 140 135 - 145 mmol/L   Potassium 3.1 (L) 3.5 - 5.1 mmol/L   Chloride 108 101 - 111 mmol/L   CO2 21 (L) 22 - 32  mmol/L   Glucose, Bld 148 (H) 65 - 99 mg/dL   BUN 10 6 - 20 mg/dL   Creatinine, Ser 0.77 0.44 - 1.00 mg/dL   Calcium 9.3 8.9 - 10.3 mg/dL   GFR calc non Af Amer >60 >60 mL/min   GFR calc Af Amer >60 >60 mL/min    Comment: (NOTE) The eGFR has been calculated using the CKD EPI equation. This calculation has not been validated in all clinical situations. eGFR's persistently <60 mL/min signify possible Chronic Kidney Disease.    Anion gap 11 5 - 15  Vitamin B12     Status: None   Collection Time: 06/13/16  7:30 AM  Result Value Ref Range   Vitamin B-12 503 180 - 914 pg/mL    Comment: (NOTE) This assay is not validated for testing neonatal or myeloproliferative syndrome specimens for Vitamin B12 levels.   Magnesium     Status: None   Collection Time: 06/13/16  7:30 AM  Result Value Ref Range   Magnesium 2.1 1.7 - 2.4 mg/dL  Glucose, capillary     Status: Abnormal   Collection Time: 06/13/16  7:54 AM  Result Value Ref Range   Glucose-Capillary 133 (H) 65 - 99 mg/dL  Glucose, capillary     Status: Abnormal   Collection Time: 06/13/16 11:57 AM  Result Value Ref Range   Glucose-Capillary 218 (H) 65 - 99 mg/dL    Current Facility-Administered Medications  Medication Dose Route Frequency Provider Last Rate Last Dose  . acetaminophen (TYLENOL) tablet 650 mg  650 mg Oral Q6H PRN Edwin Dada, MD       Or  . acetaminophen (TYLENOL) suppository 650 mg  650 mg Rectal Q6H PRN Edwin Dada, MD      . enoxaparin (LOVENOX) injection 40 mg  40 mg Subcutaneous Q24H Edwin Dada, MD   40 mg at 06/12/16 2220  . insulin aspart (novoLOG) injection 0-15 Units  0-15 Units Subcutaneous TID WC Edwin Dada, MD   5 Units at 06/13/16 1213  . insulin aspart (novoLOG) injection 0-5 Units  0-5 Units Subcutaneous QHS Edwin Dada, MD      . insulin glargine (LANTUS) injection 20 Units  20 Units Subcutaneous QHS Edwin Dada, MD   20 Units at 06/12/16 2219   . ondansetron (ZOFRAN) tablet 4 mg  4 mg Oral Q6H PRN Edwin Dada, MD       Or  . ondansetron (ZOFRAN) injection 4 mg  4 mg Intravenous Q6H PRN Edwin Dada, MD   4 mg at 06/13/16 0907  . sodium chloride flush (NS) 0.9 % injection 3 mL  3 mL Intravenous Q12H Edwin Dada, MD   3 mL  at 06/12/16 2220    Musculoskeletal: Strength & Muscle Tone: within normal limits Gait & Station: unable to stand Patient leans: N/A  Psychiatric Specialty Exam: Physical Exam as per history and physical   ROS patient has behavioral problems and confusion state and forgetfulness and questionable seizure episodes. No Fever-chills, No Hearing with Vision or hearing, reports vertigo No problems swallowing food or Liquids, No Chest pain, Cough or Shortness of Breath, No Abdominal pain, No Nausea or Vommitting, Bowel movements are regular, No Blood in stool or Urine, No dysuria, No new skin rashes or bruises, No new joints pains-aches,  No new weakness, tingling, numbness in any extremity, No recent weight gain or loss, No polyuria, polydypsia or polyphagia,   A full 10 point Review of Systems was done, except as stated above, all other Review of Systems were negative.  Blood pressure (!) 116/53, pulse 69, temperature 97.7 F (36.5 C), temperature source Oral, resp. rate 17, SpO2 100 %.There is no height or weight on file to calculate BMI.  General Appearance: Casual  Eye Contact:  Good  Speech:  Clear and Coherent  Volume:  Normal  Mood:  Euthymic  Affect:  Appropriate and Congruent  Thought Process:  Coherent and Goal Directed  Orientation:  Full (Time, Place, and Person)  Thought Content:  Logical  Suicidal Thoughts:  No  Homicidal Thoughts:  No  Memory:  Immediate;   Fair Recent;   Poor Remote;   Fair  Judgement:  Impaired  Insight:  Shallow  Psychomotor Activity:  Decreased  Concentration:  Concentration: Fair and Attention Span: Fair  Recall:  Poor  Fund of  Knowledge:  Fair  Language:  Good  Akathisia:  Negative  Handed:  Right  AIMS (if indicated):     Assets:  Communication Skills Desire for Improvement Financial Resources/Insurance Housing Leisure Time Transportation  ADL's:  Intact  Cognition:  WNL except memory difficulties  Sleep:        Treatment Plan Summary: 21 years old female with Asperger disorder, status post brain surgery, uncontrolled type 2 diabetes, presented with running away from home and repeated acute confusional state and questionable seizure-like activity required neurologically workup.  Recommended no psychiatric interventions at this time Patient needed close supervision and for fall risk No psychiatric medication was recommended during this evaluation. Appreciate psychiatric consultation  Please contact [phone] or [phone] if needs further assistance  Disposition: Supportive therapy provided about ongoing stressors.  Ambrose Finland, MD 06/13/2016 12:39 PM

## 2016-06-14 DIAGNOSIS — F845 Asperger's syndrome: Secondary | ICD-10-CM

## 2016-06-14 DIAGNOSIS — R569 Unspecified convulsions: Secondary | ICD-10-CM

## 2016-06-14 LAB — GLUCOSE, CAPILLARY
GLUCOSE-CAPILLARY: 321 mg/dL — AB (ref 65–99)
Glucose-Capillary: 225 mg/dL — ABNORMAL HIGH (ref 65–99)
Glucose-Capillary: 207 mg/dL — ABNORMAL HIGH (ref 65–99)
Glucose-Capillary: 191 mg/dL — ABNORMAL HIGH (ref 65–99)
Glucose-Capillary: 283 mg/dL — ABNORMAL HIGH (ref 65–99)
Glucose-Capillary: 149 mg/dL — ABNORMAL HIGH (ref 65–99)

## 2016-06-14 LAB — CBC
HEMATOCRIT: 40.9 % (ref 36.0–46.0)
Hemoglobin: 13.6 g/dL (ref 12.0–15.0)
MCH: 28.3 pg (ref 26.0–34.0)
MCHC: 33.3 g/dL (ref 30.0–36.0)
MCV: 85.2 fL (ref 78.0–100.0)
PLATELETS: 285 10*3/uL (ref 150–400)
RBC: 4.8 MIL/uL (ref 3.87–5.11)
RDW: 13.8 % (ref 11.5–15.5)
WBC: 6.8 10*3/uL (ref 4.0–10.5)

## 2016-06-14 LAB — BASIC METABOLIC PANEL
Anion gap: 13 (ref 5–15)
BUN: 12 mg/dL (ref 6–20)
CHLORIDE: 105 mmol/L (ref 101–111)
CO2: 17 mmol/L — AB (ref 22–32)
Calcium: 9.4 mg/dL (ref 8.9–10.3)
Creatinine, Ser: 0.82 mg/dL (ref 0.44–1.00)
GFR calc Af Amer: >60 mL/min (ref >60)
GLUCOSE: 308 mg/dL — AB (ref 65–99)
POTASSIUM: 3.2 mmol/L — AB (ref 3.5–5.1)
Sodium: 135 mmol/L (ref 135–145)

## 2016-06-14 LAB — HEMOGLOBIN A1C
HEMOGLOBIN A1C: 10.9 % — AB (ref 4.8–5.6)
MEAN PLASMA GLUCOSE: 266 mg/dL

## 2016-06-14 LAB — RAPID URINE DRUG SCREEN, HOSP PERFORMED
AMPHETAMINES: NOT DETECTED
BENZODIAZEPINES: POSITIVE — AB
Barbiturates: NOT DETECTED
Cocaine: NOT DETECTED
OPIATES: NOT DETECTED
Tetrahydrocannabinol: NOT DETECTED

## 2016-06-14 MED ORDER — LAMOTRIGINE 25 MG PO TABS: 25.0000 mg | ORAL_TABLET | Freq: Every day | ORAL | Status: DC

## 2016-06-14 MED ORDER — INSULIN GLARGINE 100 UNIT/ML ~~LOC~~ SOLN
30.0000 [IU] | Freq: Every day | SUBCUTANEOUS | Status: DC
  Filled 2016-06-14: qty 0.3

## 2016-06-14 MED ORDER — LAMOTRIGINE 25 MG PO TABS: ORAL_TABLET | ORAL | 0 refills | Status: AC

## 2016-06-14 MED ORDER — POTASSIUM CHLORIDE CRYS ER 20 MEQ PO TBCR
40.0000 meq | EXTENDED_RELEASE_TABLET | Freq: Once | ORAL | Status: AC
  Administered 2016-06-14: 40 meq via ORAL
  Filled 2016-06-14: qty 2

## 2016-06-14 MED ORDER — INSULIN ASPART 100 UNIT/ML ~~LOC~~ SOLN: 4.0000 [IU] | Freq: Three times a day (TID) | SUBCUTANEOUS | Status: DC

## 2016-06-14 NOTE — Progress Notes (Signed)
PROGRESS NOTE    Robin Murphy  WJX:[id number] DOB: 11/08/1995 DOA: 06/12/2016 [facility]CP: [facility]   Outpatient Specialists:     Brief Narrative:  Robin Murphy is a 21 y.o. 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    The patient has a history of fugue states/flights, most recently a few weeks ago in the snow, she eloped at night, woke her mother at 5AM saying "the police are in the hall, they just brought me home" because she had wandered outside without shoes/jacket, been found by bystanders lying on the ground in her pajamas.    Her mother states there was an abrupt change in her behavior about Jan. 11th.  She stopped checking her BG, her sugars went up to the 300s-400s, and mother started to notice missing cookie boxes, sunkist cans, Pop Tarts.  When confronted, the patient lied about eating them.  Mother notes she seemed depressed lately; she is NOT currently on antidepressants.  She had previously been on an insulin pump but because of insurance reasons, this was denied and she was currently on just basal bolus insulin, and mother suspects she wasn't giving herself any.  Then last night, mother had a hunch she was going into another fugue state, so she locked her in the mother's bedroom with her to sleep.  Patient was not responding, seemed "absent" and was pacing by the door all night, but couldn't figure out how to get out, urinated on herself at one point.  Then this morning, mother brought her to the ER.     Assessment & Plan:   Principal Problem:   Acute confusional state Active Problems:   Uncontrolled type 2 diabetes mellitus with hyperglycemia, with long-term current use of insulin (HCC)   Asperger's disorder   Abnormal behavior    Abnormal behavior:  Suspect depression vs temporal lobe seizures given her history of tumor removal as infant.   -Consult to Neurology: continuous  EEG negative-- await neuro recs -Consult to Psychiatry: no psych intervention at this time per Dr. Shela Commons  Insulin dependent diabetes:  -resume lantus -SSI with novolog 4 units  Sinus tachy -appears anxious -resolved  Hypokalemia -replete   DVT prophylaxis:  Lovenox   Code Status: Full Code   Family Communication: patient  Disposition Plan:     Consultants:   Psych  neuro    Subjective: Says she doesn't remember anything from yesterday   Objective: Vitals:   06/14/16 0500 06/14/16 0530 06/14/16 0912 06/14/16 1307  BP: 128/77 128/72  121/66  Pulse: 88 98  95  Resp: 18 18  20   Temp: 97.8 F (36.6 C) 98.2 F (36.8 C) 98.5 F (36.9 C) 98.2 F (36.8 C)  TempSrc: Oral Oral Oral Oral  SpO2: 100% 100%  99%  Weight:      Height:        Intake/Output Summary (Last 24 hours) at 06/14/16 1415 Last data filed at 06/14/16 0600  Gross per 24 hour  Intake              240 ml  Output                0 ml  Net              240 ml   Filed Weights   06/13/16 1421 06/14/16 0453  Weight: 63.5 kg (140 lb) 83.4 kg (183 lb 14.4 oz)    Examination:  General exam: Appears calm and comfortable- eating Respiratory system: Clear to auscultation. Respiratory effort normal. Cardiovascular system: S1 & S2 heard, RRR. No JVD, murmurs, rubs, gallops or clicks. No pedal edema. Gastrointestinal system: Abdomen is nondistended, soft and nontender. No organomegaly or masses felt. Normal bowel sounds heard. Central nervous system: Alert    Data Reviewed: I have personally reviewed following labs and imaging studies  CBC:  Recent Labs Lab 06/12/16 0933 06/13/16 0730 06/14/16 0733  WBC 8.4 6.0 6.8  HGB 13.2 13.0 13.6  HCT 37.7 39.6 40.9  MCV 83.5 85.9 85.2  PLT 244 270 285   Basic Metabolic Panel:  Recent Labs Lab 06/12/16 0933 06/13/16 0730 06/14/16 0733  NA 137 140 135  K 3.5 3.1* 3.2*  CL 107 108 105  CO2 20* 21* 17*  GLUCOSE 221* 148* 308*  BUN 13 10  12   CREATININE 0.70 0.77 0.82  CALCIUM 9.3 9.3 9.4  MG  --  2.1  --    GFR: Estimated Creatinine Clearance: 115.8 mL/min (by C-G formula based on SCr of 0.82 mg/dL). Liver Function Tests:  Recent Labs Lab 06/12/16 0933  AST 23  ALT 20  ALKPHOS 54  BILITOT 0.6  PROT 7.8  ALBUMIN 4.3   No results for input(s): LIPASE, AMYLASE in the last 168 hours.  Recent Labs Lab 06/12/16 0933  AMMONIA 34   Coagulation Profile: No results for input(s): INR, PROTIME in the last 168 hours. Cardiac Enzymes: No results for input(s): CKTOTAL, CKMB, CKMBINDEX, TROPONINI in the last 168 hours. BNP (last 3 results) No results for input(s): PROBNP in the last 8760 hours. HbA1C:  Recent Labs  06/13/16 0530  HGBA1C 10.9*   CBG:  Recent Labs Lab 06/13/16 1803 06/13/16 2128 06/14/16 0558 06/14/16 0811 06/14/16 1152  GLUCAP 225* 207* 191* 283* 321*   Lipid Profile: No results for input(s): CHOL, HDL, LDLCALC, TRIG, CHOLHDL, LDLDIRECT in the last 72 hours. Thyroid Function Tests:  Recent Labs  06/12/16 0933  TSH 2.247   Anemia Panel:  Recent Labs  06/13/16 0730  VITAMINB12 503   Urine analysis:    Component Value Date/Time   COLORURINE YELLOW (A) 06/12/2016 0933   APPEARANCEUR CLEAR (A) 06/12/2016 0933   LABSPEC 1.015 06/12/2016 0933   PHURINE 6.0 06/12/2016 0933   GLUCOSEU 150 (A) 06/12/2016 0933   HGBUR SMALL (A) 06/12/2016 0933   BILIRUBINUR NEGATIVE 06/12/2016 0933   KETONESUR 20 (A) 06/12/2016 0933   PROTEINUR NEGATIVE 06/12/2016 0933   NITRITE NEGATIVE 06/12/2016 0933   LEUKOCYTESUR NEGATIVE 06/12/2016 0933    ) Recent Results (from the past 240 hour(s))  Urine culture     Status: Abnormal   Collection Time: 06/12/16  9:33 AM  Result Value Ref Range Status   Specimen Description URINE, CLEAN CATCH  Final   Special Requests Normal  Final   Culture (A)  Final    <10,000 COLONIES/mL INSIGNIFICANT GROWTH Performed at [facility], [address] [place], [place] [address]    Report Status 06/13/2016 FINAL  Final      Anti-infectives    None       Radiology Studies: Mr Brain Wo Contrast  Result Date: 06/13/2016 CLINICAL DATA:  Seizures. Unusual behavior. Altered mental status. Asperger's syndrome. Diabetes mellitus EXAM: MRI HEAD WITHOUT CONTRAST TECHNIQUE: Multiplanar, multiecho pulse sequences of the brain and surrounding structures were obtained without intravenous contrast. COMPARISON:  None. FINDINGS: Significant motion degradation. The patient could not complete the examination despite administration  of medications. No postcontrast images were obtained. Only sagittal T1, axial and coronal diffusion, T2, and FLAIR images were obtained. Brain: No evidence for acute stroke, acute hemorrhage, mass lesion, hydrocephalus, or extra-axial fluid. 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 There is extensive encephalomalacia extending as far back as 5 cm from the temporal tip. Gliosis in the residual tissue is suggested on motion degraded FLAIR imaging. Almost certainly there is brain substance loss involving the hippocampus based on the CT appearance with encephalomalacia of the medial and inferior temporal lobe, extending to the suprasellar cistern. No gradient sequence is obtained to suggest whether there is superimposed hemosiderin staining of the surgical bed or surrounding sulci. Vascular: Normal flow voids. Skull and upper cervical spine: Normal marrow signal.Physiologic prominence of the pituitary, convex upward, noncompressive. Sinuses/Orbits: No gross abnormality. Other: None. IMPRESSION: Motion degraded examination demonstrating extensive RIGHT temporal lobe encephalomalacia. Post infusion imaging was not obtained, although there are no concerning features for an active process or residual tumor on this noncontrast exam. Electronically Signed   By: Elsie StainJohn T Curnes  M.D.   On: 06/13/2016 10:56        Scheduled Meds: . enoxaparin (LOVENOX) injection  40 mg Subcutaneous Q24H  . insulin aspart  0-15 Units Subcutaneous TID WC  . insulin aspart  0-5 Units Subcutaneous QHS  . insulin glargine  20 Units Subcutaneous QHS  . sodium chloride flush  3 mL Intravenous Q12H   Continuous Infusions:   LOS: 1 day    Time spent: 25 min    Nalda Shackleford U Ewan Grau, DO [facility] Pager [phone]  If 7PM-7AM, please contact night-coverage [email or web address] Password [email or web address] 06/14/2016, 2:15 PM

## 2016-06-14 NOTE — Procedures (Signed)
LTM-EEG Report  HISTORY: Continuous video-EEG monitoring performed for 21 year old with Asperger's syndrome, presenting with acute confusion. ACQUISITION: International 10-20 system for electrode placement; 18 channels with additional eyes linked to ipsilateral ears and EKG. Additional T1-T2 electrodes were used. Continuous video recording obtained.   EEG NUMBER:  MEDICATIONS:  Day 1: LZP  DAY #1: from 1206 06/13/16 to 0730 06/14/16  BACKGROUND: An overall medium voltage continuous recording with good spontaneous variability and reactivity. Waking background consisted of a medium voltage 10-11Hz  posterior dominant rhythm bilaterally with low voltage beta activity in the bilateral frontocentral regions and some medium voltage theta activity diffusely. Sleep was captured with normal stage II sleep architecture.   EPILEPTIFORM/PERIODIC ACTIVITY: none SEIZURES: none EVENTS: There was 1 clinical event at 0030 with a fall out of bed.  The patient awoke and attempted to climb out of bed, paused, and then collapsed to the ground with no shaking.  There is no change to the patient's normal waking EEG background with this event.  EKG: no significant arrhythmia  SUMMARY: This was normal continuous video EEG with no epileptiform discharges or lateralizing signs. There was 1 clinical event of fall and confusion captured with no electrographic correlate.

## 2016-06-14 NOTE — Progress Notes (Signed)
Robin AnisJulia Murphy to be D/C'd to home per MD order.  Discussed with the patient and all questions fully answered.  VSS, Skin clean, dry and intact without evidence of skin break down, no evidence of skin tears noted. IV catheter discontinued intact. Site without signs and symptoms of complications. Dressing and pressure applied.  An After Visit Summary was printed and given to the patient. Patient received prescription.  D/c education completed with patient/family including follow up instructions, medication list, d/c activities limitations if indicated, with other d/c instructions as indicated by MD - patient able to verbalize understanding, all questions fully answered.   Patient instructed to return to ED, call 911, or call MD for any changes in condition.   Patient escorted via WC, and D/C home via private auto.  Robin HaffKayla L Murphy 06/14/2016 7:18 PM

## 2016-06-14 NOTE — Discharge Summary (Signed)
Physician Discharge Summary  Robin Murphy ZOX:[id number] DOB: 23-Sep-1995 DOA: 06/12/2016  [facility]  Admit date: 06/12/2016 Discharge date: 06/14/2016   Recommendations for Outpatient Follow-Up:   1. Outpatient referral to neurology placed 2. Will need outpatient referral to psych done by PCP 3. lamictal started and will ned titration: 25 mg at bedtime and increase by 25 mg every week to goal of 75mg  BID   Discharge Diagnosis:   Principal Problem:   Acute confusional state Active Problems:   Uncontrolled type 2 diabetes mellitus with hyperglycemia, with long-term current use of insulin (HCC)   Asperger's disorder   Abnormal behavior   Discharge disposition:  Home.  Discharge Condition: Improved.  Diet recommendation:.  Carbohydrate-modified  Wound care: None.   History of Present Illness:   Robin Murphy is a 21 y.o. 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    The patient has a history of fugue states/flights, most recently a few weeks ago in the snow, she eloped at night, woke her mother at 5AM saying "the police are in the hall, they just brought me home" because she had wandered outside without shoes/jacket, been found by bystanders lying on the ground in her pajamas.    Her mother states there was an abrupt change in her behavior about Jan. 11th.  She stopped checking her BG, her sugars went up to the 300s-400s, and mother started to notice missing cookie boxes, sunkist cans, Pop Tarts.  When confronted, the patient lied about eating them.  Mother notes she seemed depressed lately; she is NOT currently on antidepressants.  She had previously been on an insulin pump but because of insurance reasons, this was denied and she was currently on just basal bolus insulin, and mother suspects she wasn't giving herself any.  Then last night, mother had a hunch she was going  into another fugue state, so she locked her in the mother's bedroom with her to sleep.  Patient was not responding, seemed "absent" and was pacing by the door all night, but couldn't figure out how to get out, urinated on herself at one point.  Then this morning, mother brought her to the ER.     Hospital Course by Problem:  Abnormal behavior Seizures cannot be completely excluded since we have not captured a typical episode therefore I do recommend treatment that will involve also mood stabilization. After discussing with her mother we have come to conclusion to initiate lamotrigine which can help with her mood as well as seizure disorder. -Initiated at 25 mg at bedtime and increase by 25 mg every week to goal of 75mg  BID. -She will need a follow-up in neurology clinic as well as psychiatry clinic.  Insulin dependent diabetes: -resume lantus -SSI with novolog 4 units  Hypokalemia -replete  Medical Consultants:    Psych  neuro   Discharge Exam:   Vitals:   06/14/16 0912 06/14/16 1307  BP:  121/66  Pulse:  95  Resp:  20  Temp: 98.5 F (36.9 C) 98.2 F (36.8 C)   Vitals:   06/14/16 0500 06/14/16 0530 06/14/16 0912 06/14/16 1307  BP: 128/77 128/72  121/66  Pulse: 88 98  95  Resp: 18 18  20   Temp: 97.8 F (36.6 C) 98.2 F (36.8 C) 98.5 F (36.9 C) 98.2 F (36.8 C)  TempSrc: Oral Oral Oral Oral  SpO2: 100% 100%  99%  Weight:  Height:        Gen:  NAD    The results of significant diagnostics from this hospitalization (including imaging, microbiology, ancillary and laboratory) are listed below for reference.     Procedures and Diagnostic Studies:   Ct Head Wo Contrast  Result Date: 06/12/2016 CLINICAL DATA:  Mental status change. EXAM: CT HEAD WITHOUT CONTRAST TECHNIQUE: Contiguous axial images were obtained from the base of the skull through the vertex without intravenous contrast. COMPARISON:  None. FINDINGS: Brain: Ventricles are normal in size and  configuration. 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 All other areas of the brain demonstrate normal gray-white matter attenuation. There is no mass, hemorrhage, edema or other evidence of acute parenchymal abnormality. No extra-axial hemorrhage. Vascular: No hyperdense vessel or unexpected calcification. Skull: Normal. Negative for fracture or focal lesion. Fixation hardware within the right temporal lobe. Sinuses/Orbits: No acute finding. Other: None. IMPRESSION: 1. No acute findings.  No intracranial mass, hemorrhage or edema. 2. 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 Electronically Signed   By: Bary Richard M.D.   On: 06/12/2016 10:02   Mr Brain Wo Contrast  Result Date: 06/13/2016 CLINICAL DATA:  Seizures. Unusual behavior. Altered mental status. Asperger's syndrome. Diabetes mellitus EXAM: MRI HEAD WITHOUT CONTRAST TECHNIQUE: Multiplanar, multiecho pulse sequences of the brain and surrounding structures were obtained without intravenous contrast. COMPARISON:  None. FINDINGS: Significant motion degradation. The patient could not complete the examination despite administration of medications. No postcontrast images were obtained. Only sagittal T1, axial and coronal diffusion, T2, and FLAIR images were obtained. Brain: No evidence for acute stroke, acute hemorrhage, mass lesion, hydrocephalus, or extra-axial fluid. 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 There is extensive encephalomalacia extending as far back as 5 cm from the temporal tip. Gliosis in the residual tissue is suggested on motion degraded FLAIR imaging. Almost certainly there is brain substance loss involving the hippocampus based on  the CT appearance with encephalomalacia of the medial and inferior temporal lobe, extending to the suprasellar cistern. No gradient sequence is obtained to suggest whether there is superimposed hemosiderin staining of the surgical bed or surrounding sulci. Vascular: Normal flow voids. Skull and upper cervical spine: Normal marrow signal.Physiologic prominence of the pituitary, convex upward, noncompressive. Sinuses/Orbits: No gross abnormality. Other: None. IMPRESSION: Motion degraded examination demonstrating extensive RIGHT temporal lobe encephalomalacia. Post infusion imaging was not obtained, although there are no concerning features for an active process or residual tumor on this noncontrast exam. Electronically Signed   By: Elsie Stain M.D.   On: 06/13/2016 10:56   Dg Chest Portable 1 View  Result Date: 06/12/2016 CLINICAL DATA:  Altered mental status. EXAM: PORTABLE CHEST 1 VIEW COMPARISON:  None. FINDINGS: The heart size and mediastinal contours are within normal limits. Both lungs are clear. No pneumothorax or pleural effusion is noted. The visualized skeletal structures are unremarkable. IMPRESSION: No acute cardiopulmonary abnormality seen. Electronically Signed   By: Lupita Raider, M.D.   On: 06/12/2016 09:51     Labs:   Basic Metabolic Panel:  Recent Labs Lab 06/12/16 0933 06/13/16 0730 06/14/16 0733  NA 137 140 135  K 3.5 3.1* 3.2*  CL 107 108 105  CO2 20* 21* 17*  GLUCOSE 221* 148* 308*  BUN 13 10 12   CREATININE 0.70 0.77 0.82  CALCIUM 9.3 9.3 9.4  MG  --  2.1  --    GFR Estimated Creatinine Clearance: 115.8 mL/min (by C-G formula based on SCr of 0.82 mg/dL). Liver Function Tests:  Recent Labs Lab 06/12/16 0933  AST 23  ALT 20  ALKPHOS 54  BILITOT 0.6  PROT 7.8  ALBUMIN 4.3   No results for input(s): LIPASE, AMYLASE in the last 168 hours.  Recent Labs Lab 06/12/16 0933  AMMONIA 34   Coagulation profile No results for input(s): INR, PROTIME in the  last 168 hours.  CBC:  Recent Labs Lab 06/12/16 0933 06/13/16 0730 06/14/16 0733  WBC 8.4 6.0 6.8  HGB 13.2 13.0 13.6  HCT 37.7 39.6 40.9  MCV 83.5 85.9 85.2  PLT 244 270 285   Cardiac Enzymes: No results for input(s): CKTOTAL, CKMB, CKMBINDEX, TROPONINI in the last 168 hours. BNP: Invalid input(s): POCBNP CBG:  Recent Labs Lab 06/13/16 2128 06/14/16 0558 06/14/16 0811 06/14/16 1152 06/14/16 1713  GLUCAP 207* 191* 283* 321* 149*   D-Dimer No results for input(s): DDIMER in the last 72 hours. Hgb A1c  Recent Labs  06/13/16 0530  HGBA1C 10.9*   Lipid Profile No results for input(s): CHOL, HDL, LDLCALC, TRIG, CHOLHDL, LDLDIRECT in the last 72 hours. Thyroid function studies  Recent Labs  06/12/16 0933  TSH 2.247   Anemia work up  Recent Labs  06/13/16 0730  VITAMINB12 503   Microbiology Recent Results (from the past 240 hour(s))  Urine culture     Status: Abnormal   Collection Time: 06/12/16  9:33 AM  Result Value Ref Range Status   Specimen Description URINE, CLEAN CATCH  Final   Special Requests Normal  Final   Culture (A)  Final    <10,000 COLONIES/mL INSIGNIFICANT GROWTH Performed at [facility], [address], [place], [place] [address]    Report Status 06/13/2016 FINAL  Final     Discharge Instructions:   Discharge Instructions    Ambulatory referral to Neurology    Complete by:  As directed    An appointment is requested in approximately: 4 weeks-- patient lives in [place]     Allergies as of 06/14/2016   Not on File     Medication List    STOP taking these medications   venlafaxine XR 37.5 MG 24 hr capsule Commonly known as:  EFFEXOR-XR     TAKE these medications   acetaminophen 500 MG tablet Commonly known as:  TYLENOL Take 500 mg by mouth every 6 (six) hours as needed.   insulin aspart 100 UNIT/ML injection Commonly known as:  novoLOG Inject 14-24 Units into the skin 3 (three) times daily before meals.     NOVOLOG FLEXPEN 100 UNIT/ML FlexPen Generic drug:  insulin aspart Inject 14 Units into the skin 3 (three) times daily with meals.   insulin glargine 100 UNIT/ML injection Commonly known as:  LANTUS Inject 30 Units into the skin at bedtime as needed. Up to 30 units qhs and additional dosage 2 hours after her meals prn   lamoTRIgine 25 MG tablet Commonly known as:  LAMICTAL 25 mg at bedtime and increase by 25 mg every week to goal of 75mg  BID      Follow-up Information    [facility] in 1 week(s).   Specialty:  General Practice Contact information: [address] [place] [address] [phone]  referral to neurology placed Follow up.        will need outpatient psych referral by PCP Follow up.            Time coordinating discharge: 35 min  Signed:  Tetsuo Coppola U Angele Wiemann   [facility] 06/14/2016, 6:23 PM

## 2016-06-14 NOTE — Progress Notes (Signed)
Subjective: Interval History:  Patient had a fall last night however did not suffer any injuries nor did she hit her head. Mother was available during the second encounter and was able to provide more history.  Mother reports she has been having these episodes where she elopes from home and returns back within few hours for the past 10 years. Her last episode was about 2 weeks ago as described in history of present illness. These episodes are extremely infrequent and only happened 5-6 months apart. She also reports after her birth she did have some episodes that were concerning for seizures. 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 She was also on Depakote at young age for mood stabilization. Mother reports that she did not require long-term seizure medications. She has also been on multiple psychotropics in the past.  When I asked the patient about these episodes she only tells me that she has no memory of these episodes.  Objective: Vital signs in last 24 hours: Temp:  [97.6 F (36.4 C)-98.5 F (36.9 C)] 98.2 F (36.8 C) (02/06 1307) Pulse Rate:  [88-107] 95 (02/06 1307) Resp:  [18-20] 20 (02/06 1307) BP: (117-128)/(65-77) 121/66 (02/06 1307) SpO2:  [99 %-100 %] 99 % (02/06 1307) Weight:  [83.4 kg (183 lb 14.4 oz)] 83.4 kg (183 lb 14.4 oz) (02/06 0453)  Intake/Output from previous day: 02/05 0701 - 02/06 0700 In: 240 [P.O.:240] Out: 900 [Urine:900] Intake/Output this shift: No intake/output data recorded. Nutritional status: Diet Carb Modified Fluid consistency: Thin; Room service appropriate? Yes  HEENT-  Normocephalic, no lesions, Neck supple and no rigidity was appreciated. Cardiovascular - regular rate and rhythm Lungs - nonlabored breathing Abdomen - soft nontender and nondistended  Neurologic Examination: Mental status: Awake alert and oriented to all spheres. Poor eye contact Speech and language: No evidence of  dysarthria was appreciated. Comprehension intact. Naming intact. Fluent. Cranial nerves: Pupils approximately 3 mm and reactive to light. Extra muscles intact. Facial sensation symmetric. No facial droop is appreciated. Hearing intact. Uvula midline. Tongue midline. Motor: 5/5 throughout Sensory: Normal sensation to light touch and temperature.  Coordination: Normal finger to nose  Gait: Normal sensation and ambulated approximately 10 feet without any abnormalities  Lab Results:  Recent Labs  06/13/16 0730 06/14/16 0733  WBC 6.0 6.8  HGB 13.0 13.6  HCT 39.6 40.9  PLT 270 285  NA 140 135  K 3.1* 3.2*  CL 108 105  CO2 21* 17*  GLUCOSE 148* 308*  BUN 10 12  CREATININE 0.77 0.82  CALCIUM 9.3 9.4   Lipid Panel No results for input(s): CHOL, TRIG, HDL, CHOLHDL, VLDL, LDLCALC in the last 72 hours.  Studies/Results: Mr Brain Wo Contrast  Result Date: 06/13/2016 CLINICAL DATA:  Seizures. Unusual behavior. Altered mental status. Asperger's syndrome. Diabetes mellitus EXAM: MRI HEAD WITHOUT CONTRAST TECHNIQUE: Multiplanar, multiecho pulse sequences of the brain and surrounding structures were obtained without intravenous contrast. COMPARISON:  None. FINDINGS: Significant motion degradation. The patient could not complete the examination despite administration of medications. No postcontrast images were obtained. Only sagittal T1, axial and coronal diffusion, T2, and FLAIR images were obtained. Brain: No evidence for acute stroke, acute hemorrhage, mass lesion, hydrocephalus, or extra-axial fluid. 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 There is extensive encephalomalacia extending as far back as 5 cm from the temporal tip. Gliosis in the residual  tissue is suggested on motion degraded FLAIR imaging. Almost certainly there is brain substance loss involving the hippocampus based on the CT appearance with  encephalomalacia of the medial and inferior temporal lobe, extending to the suprasellar cistern. No gradient sequence is obtained to suggest whether there is superimposed hemosiderin staining of the surgical bed or surrounding sulci. Vascular: Normal flow voids. Skull and upper cervical spine: Normal marrow signal.Physiologic prominence of the pituitary, convex upward, noncompressive. Sinuses/Orbits: No gross abnormality. Other: None. IMPRESSION: Motion degraded examination demonstrating extensive RIGHT temporal lobe encephalomalacia. Post infusion imaging was not obtained, although there are no concerning features for an active process or residual tumor on this noncontrast exam. Electronically Signed   By: Elsie Stain M.D.   On: 06/13/2016 10:56    Medications:  Scheduled: . enoxaparin (LOVENOX) injection  40 mg Subcutaneous Q24H  . insulin aspart  0-15 Units Subcutaneous TID WC  . insulin aspart  0-5 Units Subcutaneous QHS  . insulin aspart  4 Units Subcutaneous TID WC  . insulin glargine  30 Units Subcutaneous QHS  . lamoTRIgine  25 mg Oral QHS  . 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 Clinically these episodes are not convincing for partial seizures however patient does have right parietal lesion that was resected. Seizure focus is a possibility however 24-hour EEG did not demonstrate any abnormality. I do not think continue with EEG will have any further utility at this time since these episodes are extremely far apart.  -Seizures cannot be completely excluded since we have not captured a typical episode therefore I do recommend treatment that will involve also mood stabilization. After discussing with her mother we have come  to conclusion to initiate lamotrigine which can help with her mood as well as seizure disorder. -Initiated at 25 mg at bedtime and increase by 25 mg every week to goal of 75mg  BID. -She will need a follow-up in neurology clinic as well as psychiatry clinic.  Side effects of lamotrigine were discussed with the patient and her mother in detail including rash which should be reported immediately. Risk of stevens Laural Benes was discussed. Seizure precautions discussed with patient and her mother. Pt does not drive.    LOS: 1 day   Luan Pulling

## 2016-06-14 NOTE — Progress Notes (Addendum)
Inpatient Diabetes Program Recommendations  AACE/ADA: New Consensus Statement on Inpatient Glycemic Control (2015)  Target Ranges:  Prepandial:   less than 140 mg/dL      Peak postprandial:   less than 180 mg/dL (1-2 hours)      Critically ill patients:  140 - 180 mg/dL   Lab Results  Component Value Date   GLUCAP 321 (H) 06/14/2016   HGBA1C 10.9 (H) 06/13/2016   Results for Robin Murphy, Robin Murphy (MRN [id number]) as of 06/14/2016 14:07  Ref. Range 06/13/2016 07:54 06/13/2016 11:57 06/13/2016 18:03 06/13/2016 21:28 06/14/2016 05:58 06/14/2016 08:11 06/14/2016 11:52  Glucose-Capillary Latest Ref Range: 65 - 99 mg/dL 409133 (H) 811218 (H) 914225 (H) 207 (H) 191 (H) 283 (H) 321 (H)   Current orders for Inpatient glycemic control:     Moderate correction scale Novolog 0-15 units TIDAC and 0-5 units QHS    Lantus 20 units QHS  Inpatient Diabetes Program Recommendations:        Please consider Meal Coverage of Novolog 4 units TIDAC if patient eats > 50% of meal.     A1C of 10.9% (269 mg/dL average glucose level).  Thank you,  Kristine LineaKaren Damain Broadus, RN, MSN Diabetes Coordinator Inpatient Diabetes Program [phone] (Team Pager)

## 2016-06-14 NOTE — Progress Notes (Signed)
Nurse Boneta LucksJenny heard a 'thump' and bed alarm was going off. Entered room and found Pt. Laying on her left side. Her EEG leads intact except for one loose one. Pt placed back into bed  Per nursing staff,with Pt able to help as well. Neuro check unremarkable from before. Pt states no new pain. Her lower back hurt,  her right hip and her left shoulder all hurt her before. No bruising noted upon exam. MD aware. Fall risk a 16. Bed alarm and tele sitter ongoing.

## 2016-06-14 NOTE — Progress Notes (Signed)
LTM EEG discontinued. No skin breakdown noted. 

## 2018-05-03 IMAGING — MR MR HEAD W/O CM
7 series · 39 of 48 positions shown · non-contrast
Comparison: None.

CLINICAL DATA: Seizures. Unusual behavior. Altered mental status.
Asperger's syndrome. Diabetes mellitus

EXAM:
MRI HEAD WITHOUT CONTRAST
TECHNIQUE: Multiplanar, multiecho pulse sequences of the brain and surrounding
structures were obtained without intravenous contrast.

[Series 3: T1 · sagittal · 5.0mm · 0.47mm/px · 2 of 23 slices shown]
[im 1/23]
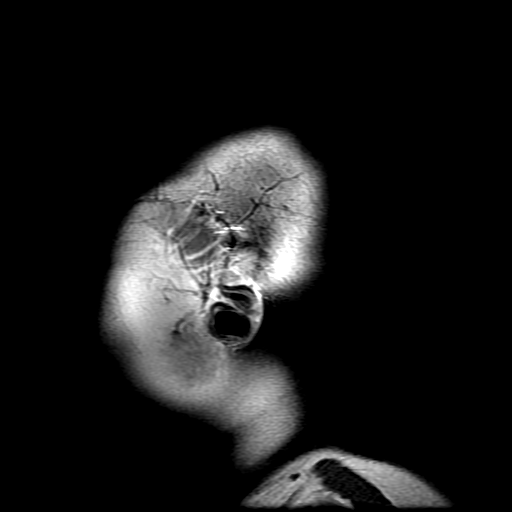
[im 12/23]
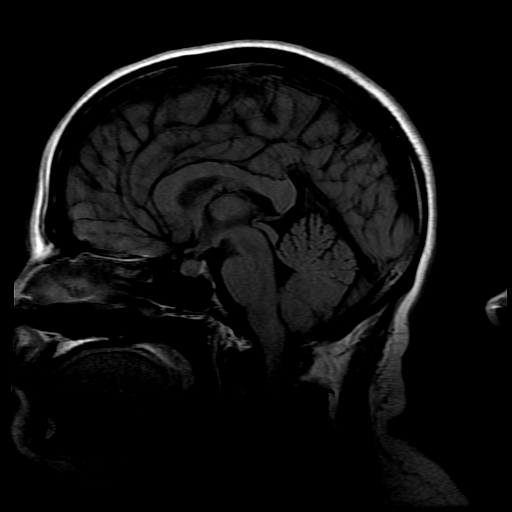

[Series 4: DWI · axial · 3.0mm · 1.09mm/px · z∈[-95,+64]mm · 9 of 108 slices shown (1 of 4)]
[im 1/108]
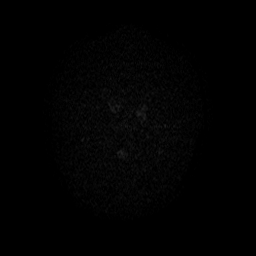
[im 16/108]
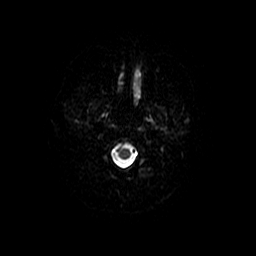
[im 31/108]
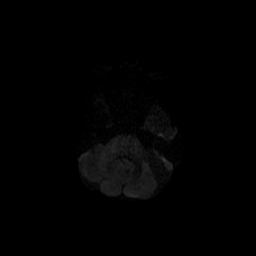
[im 46/108]
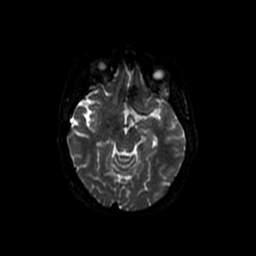
[im 54/108]
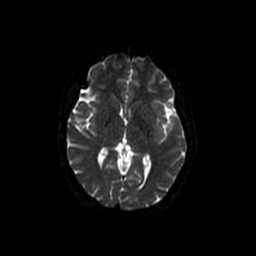
[im 62/108]
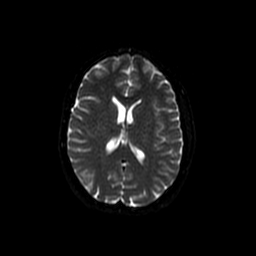
[im 77/108]
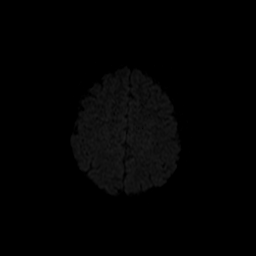
[im 92/108]
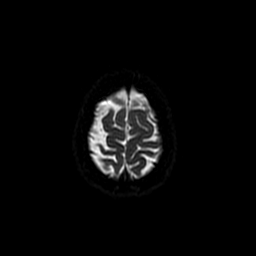
[im 108/108]
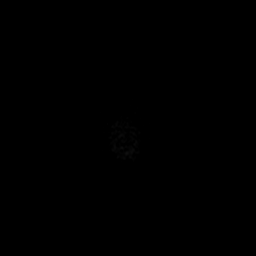

[Series 7: T2 · axial · 5.0mm · 0.43mm/px · z∈[-62,+94]mm · 4 of 27 slices shown]
[im 1/27]
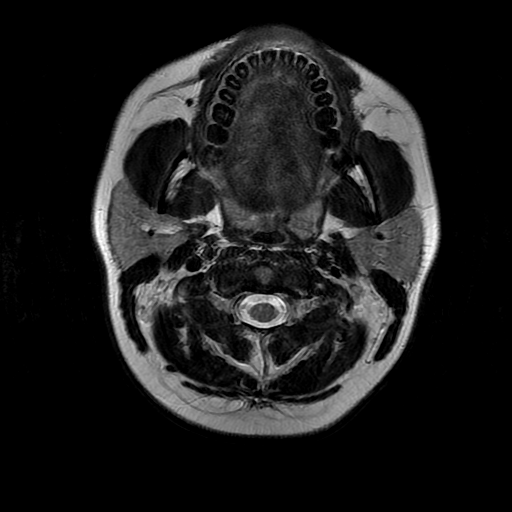
[im 9/27]
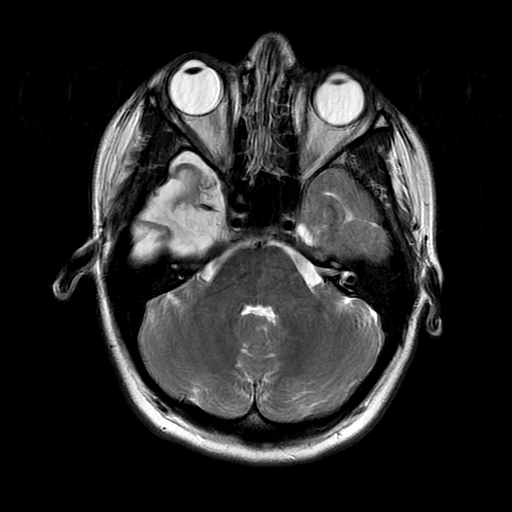
[im 18/27]
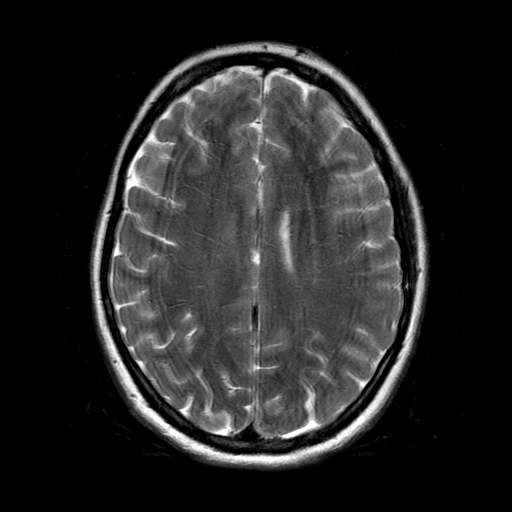
[im 27/27]
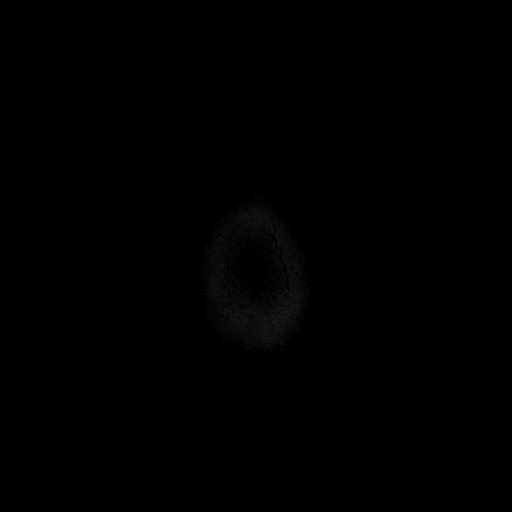

[Series 8: DWI · coronal · 5.0mm · 1.09mm/px · 8 of 72 slices shown (2 of 4)]
[im 1/72]
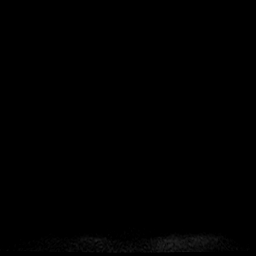
[im 8/72]
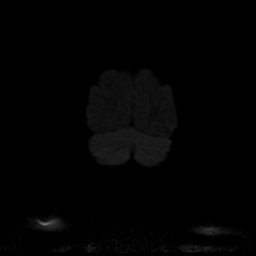
[im 24/72]
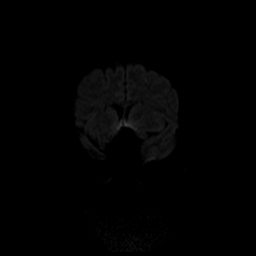
[im 32/72]
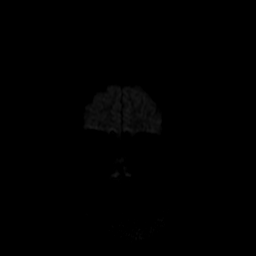
[im 40/72]
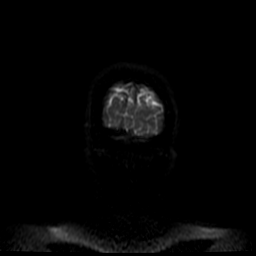
[im 48/72]
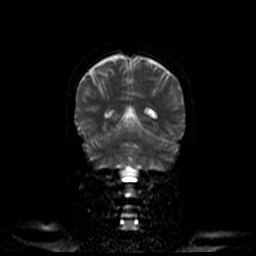
[im 64/72]
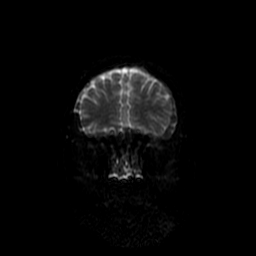
[im 72/72]
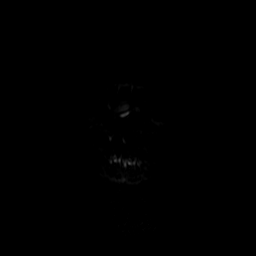

[Series 9: FLAIR · axial · 5.0mm · 0.43mm/px · z∈[-62,+94]mm · 4 of 27 slices shown]
[im 1/27]
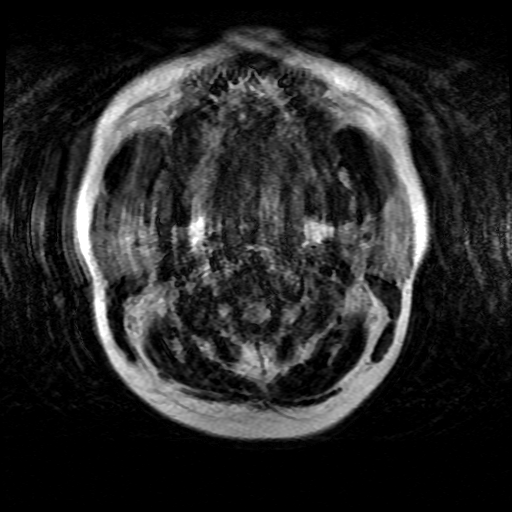
[im 9/27]
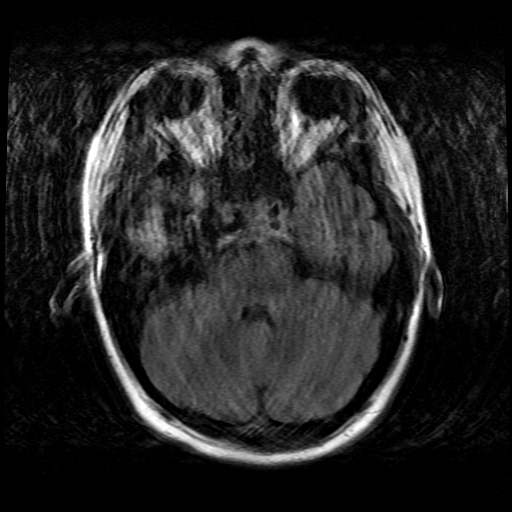
[im 18/27]
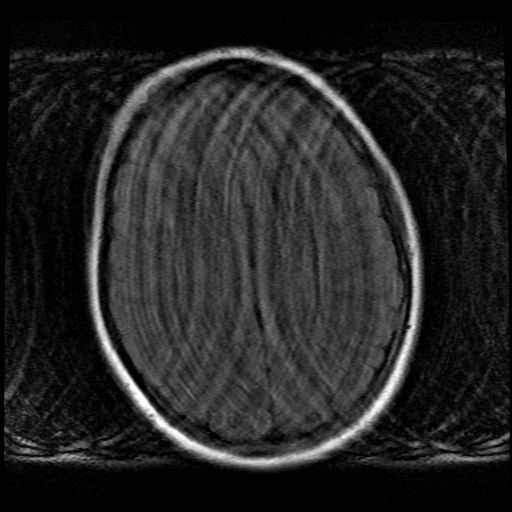
[im 27/27]
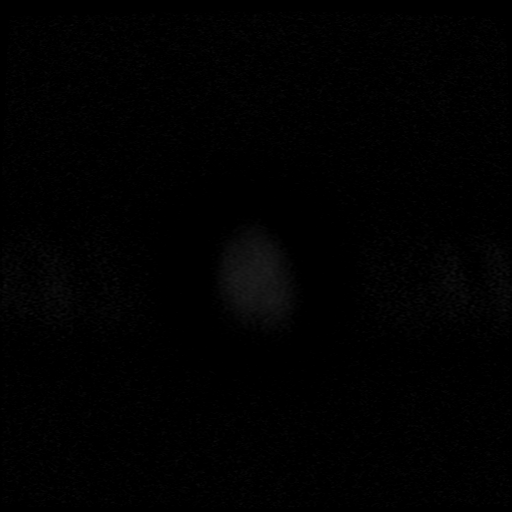

[Series 400: DWI · axial · 3.0mm · 1.09mm/px · z∈[-95,+64]mm · 7 of 54 slices shown (3 of 4)]
[im 1/54]
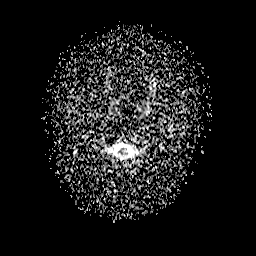
[im 9/54]
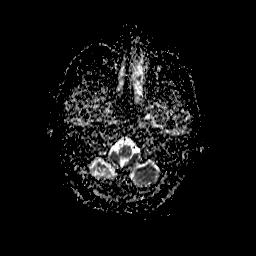
[im 18/54]
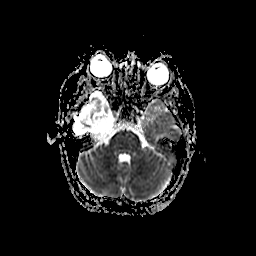
[im 27/54]
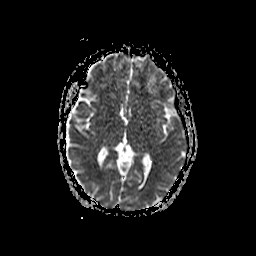
[im 36/54]
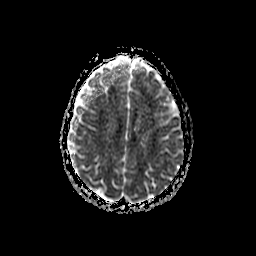
[im 45/54]
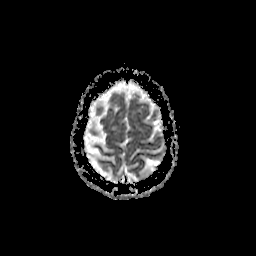
[im 54/54]
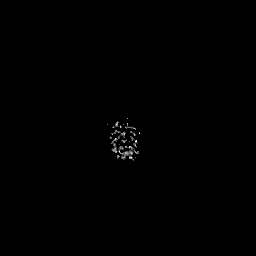

[Series 800: DWI · coronal · 5.0mm · 1.09mm/px · 5 of 36 slices shown (4 of 4)]
[im 1/36]
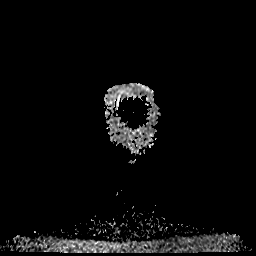
[im 9/36]
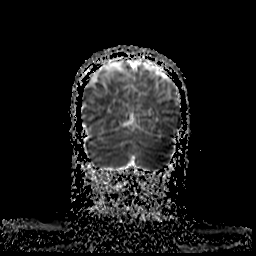
[im 18/36]
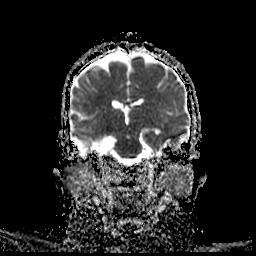
[im 27/36]
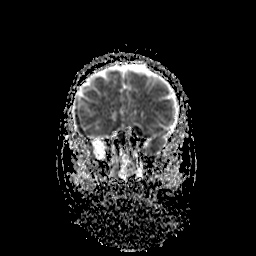
[im 36/36]
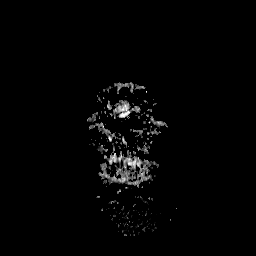

[39 of 48 positions shown; findings below may reference images not displayed]

FINDINGS: Significant motion degradation. The patient could not complete the
examination despite administration of medications. No postcontrast
images were obtained. Only sagittal T1, axial and coronal diffusion,
T2, and FLAIR images were obtained.

Brain: No evidence for acute stroke, acute hemorrhage, mass lesion,
hydrocephalus, or extra-axial fluid.

There has been a remote craniotomy for resection of what reportedly
was a benign brain tumor in the RIGHT temporal lobe. There is
extensive encephalomalacia extending as far back as 5 cm from the
temporal tip. Gliosis in the residual tissue is suggested on motion
degraded FLAIR imaging. Almost certainly there is brain substance
loss involving the hippocampus based on the CT appearance with
encephalomalacia of the medial and inferior temporal lobe, extending
to the suprasellar cistern.

No gradient sequence is obtained to suggest whether there is
superimposed hemosiderin staining of the surgical bed or surrounding
sulci.

Vascular: Normal flow voids.

Skull and upper cervical spine: Normal marrow signal.Physiologic
prominence of the pituitary, convex upward, noncompressive.

Sinuses/Orbits: No gross abnormality.

Other: None.
IMPRESSION: Motion degraded examination demonstrating extensive RIGHT temporal
lobe encephalomalacia.

Post infusion imaging was not obtained, although there are no
concerning features for an active process or residual tumor on this
noncontrast exam.
